# Patient Record
Sex: Female | Born: 1990 | Race: White | Hispanic: No | Marital: Married | Smoking: Former smoker
Health system: Southern US, Community
[De-identification: ages and names within clinical notes are randomized; demographics above are authoritative.]

## PROBLEM LIST (undated history)

## (undated) DIAGNOSIS — F411 Generalized anxiety disorder: Secondary | ICD-10-CM

## (undated) DIAGNOSIS — J4520 Mild intermittent asthma, uncomplicated: Secondary | ICD-10-CM

## (undated) HISTORY — PX: WISDOM TOOTH EXTRACTION: SHX21

## (undated) HISTORY — DX: Generalized anxiety disorder: F41.1

## (undated) HISTORY — DX: Mild intermittent asthma, uncomplicated: J45.20

---

## 2015-09-07 ENCOUNTER — Encounter: Payer: Self-pay | Admitting: Emergency Medicine

## 2015-09-07 ENCOUNTER — Emergency Department (INDEPENDENT_AMBULATORY_CARE_PROVIDER_SITE_OTHER)
Admission: EM | Admit: 2015-09-07 | Discharge: 2015-09-07 | Disposition: A | Discharge status: Home / Self Care | Payer: 59 | Source: Home / Self Care | Attending: Family Medicine | Admitting: Family Medicine

## 2015-09-07 ENCOUNTER — Emergency Department (INDEPENDENT_AMBULATORY_CARE_PROVIDER_SITE_OTHER): Payer: 59

## 2015-09-07 DIAGNOSIS — S8392XA Sprain of unspecified site of left knee, initial encounter: Secondary | ICD-10-CM

## 2015-09-07 DIAGNOSIS — M25562 Pain in left knee: Secondary | ICD-10-CM

## 2015-09-07 MED ORDER — TRAMADOL HCL 50 MG PO TABS: 50.0000 mg | ORAL_TABLET | Freq: Four times a day (QID) | ORAL | Status: DC | PRN

## 2015-09-07 MED ORDER — IBUPROFEN 600 MG PO TABS: 600.0000 mg | ORAL_TABLET | Freq: Four times a day (QID) | ORAL | Status: DC | PRN

## 2015-09-07 NOTE — ED Provider Notes (Signed)
CSN: [id number]     Arrival date & time 09/07/15  1304 History   First MD Initiated Contact with Patient 09/07/15 1330     Chief Complaint  Patient presents with  . Knee Injury   (Consider location/radiation/quality/duration/timing/severity/associated sxs/prior Treatment) HPI  Pt is a 24yo female presenting to [facility] with c/o Left knee pain and mild swelling that started yesterday while she was participating in an obstacle race. Pt initially felt pain at the beginning of the race when she helped her team members lift a heavy object.  After that, pt felt worsening pain in the front lateral aspect of her knee.  Pain is aching and sore, worse with palpation and ambulation.  Pain did improve after acetaminophen this morning. Denies any other injuries.   History reviewed. No pertinent past medical history. Past Surgical History  Procedure Laterality Date  . Cesarean section     Family History  Problem Relation Age of Onset  . Migraines Mother    Social History  Substance Use Topics  . Smoking status: Current Every Day Smoker    Types: Cigarettes  . Smokeless tobacco: Current User  . Alcohol Use: No   OB History    No data available     Review of Systems  Constitutional: Negative for fever and chills.  Musculoskeletal: Positive for myalgias, joint swelling, arthralgias and gait problem. Negative for back pain.  Skin: Negative for color change and wound.  Neurological: Positive for weakness. Negative for numbness.    Allergies  Review of patient's allergies indicates no known allergies.  Home Medications   Prior to Admission medications   Medication Sig Start Date End Date Taking? Authorizing Provider  acetaminophen (TYLENOL) 325 MG tablet Take 650 mg by mouth every 6 (six) hours as needed.   Yes Historical Provider, MD  clonazePAM (KLONOPIN) 0.5 MG tablet Take 0.5 mg by mouth 2 (two) times daily as needed for anxiety.   Yes Historical Provider, MD  levonorgestrel (MIRENA) 20  MCG/24HR IUD 1 each by Intrauterine route once.   Yes Historical Provider, MD  ibuprofen (ADVIL,MOTRIN) 600 MG tablet Take 1 tablet (600 mg total) by mouth every 6 (six) hours as needed. 09/07/15   Junius Finner, PA-C  traMADol (ULTRAM) 50 MG tablet Take 1 tablet (50 mg total) by mouth every 6 (six) hours as needed. 09/07/15   Junius Finner, PA-C   Meds Ordered and Administered this Visit  Medications - No data to display  BP 105/72 mmHg  Pulse 78  Temp(Src) 97.4 F (36.3 C) (Oral)  Ht  (1.626 m)  Wt 162 lb (73.483 kg)  BMI 27.79 kg/m2  SpO2 98% No data found.   Physical Exam  Constitutional: She is oriented to person, place, and time. She appears well-developed and well-nourished.  HENT:  Head: Normocephalic and atraumatic.  Eyes: EOM are normal.  Neck: Normal range of motion.  Cardiovascular: Normal rate.   Pulses:      Dorsalis pedis pulses are 2+ on the left side.  Pulmonary/Chest: Effort normal.  Musculoskeletal: Normal range of motion. She exhibits edema and tenderness.  Left knee: no deformity. Mild edema. Full ROM.  Tenderness to lateral joint space. Calf is soft, non-tender  Neurological: She is alert and oriented to person, place, and time.  Skin: Skin is warm and dry. No erythema.  Psychiatric: She has a normal mood and affect. Her behavior is normal.  Nursing note and vitals reviewed.   ED Course  Procedures (including critical care time)  Labs Review Labs Reviewed - No data to display  Imaging Review Dg Knee Complete 4 Views Left  09/07/2015  CLINICAL DATA:  Left knee pain after running over the weekend EXAM: LEFT KNEE - COMPLETE 4+ VIEW COMPARISON:  None. FINDINGS: Four views of the left knee submitted. No acute fracture or subluxation. No radiopaque foreign body. IMPRESSION: Negative. Electronically Signed   By: Natasha MeadLiviu  Pop M.D.   On: 09/07/2015 13:57      MDM   1. Left knee sprain, initial encounter   2. Left knee pain    Pt c/o Left knee  pain after injury during obstacle course yesterday. Plain films: normal Reassured pt no fracture, subluxation or joint effusion however, underlying soft tissue injury including ligament or meniscus could be injured. Pt placed in knee sleeve. Crutches provided for comfort. Encouraged pt to take it easy this week with her workouts.  If she feels pain, stop. F/u with Dr. Denyse Amassorey, Sports Medicine in 1-2 weeks if not improving. Sooner if worsening. Home exercises provided. Patient verbalized understanding and agreement with treatment plan.     Junius FinnerErin O'Malley, PA-C 09/07/15 [phone]

## 2015-09-07 NOTE — Discharge Instructions (Signed)
°  Tramadol is strong pain medication. While taking, do not drink alcohol, drive, or perform any other activities that requires focus while taking these medications.  ° °

## 2015-09-07 NOTE — ED Notes (Signed)
Left knee injury yesterday while running a race, knee locked up, felt excruciating pain

## 2015-11-16 ENCOUNTER — Ambulatory Visit: Payer: 59 | Admitting: Family Medicine

## 2015-11-30 ENCOUNTER — Ambulatory Visit (INDEPENDENT_AMBULATORY_CARE_PROVIDER_SITE_OTHER): Payer: 59 | Admitting: Family Medicine

## 2015-11-30 ENCOUNTER — Encounter: Payer: Self-pay | Admitting: Family Medicine

## 2015-11-30 VITALS — BP 132/77 | HR 88 | Ht 64.0 in | Wt 171.0 lb

## 2015-11-30 DIAGNOSIS — F411 Generalized anxiety disorder: Secondary | ICD-10-CM

## 2015-11-30 DIAGNOSIS — Z0000 Encounter for general adult medical examination without abnormal findings: Secondary | ICD-10-CM | POA: Diagnosis not present

## 2015-11-30 DIAGNOSIS — IMO0001 Reserved for inherently not codable concepts without codable children: Secondary | ICD-10-CM

## 2015-11-30 HISTORY — DX: Generalized anxiety disorder: F41.1

## 2015-11-30 MED ORDER — CLONAZEPAM 0.5 MG PO TABS: 0.5000 mg | ORAL_TABLET | Freq: Two times a day (BID) | ORAL | Status: DC | PRN

## 2015-11-30 NOTE — Patient Instructions (Signed)
Thank you for coming in today. Return in 1 year or sooner as needed.  Let me know if you need help quitting smoking.   Smoking Cessation, Tips for Success If you are ready to quit smoking, congratulations! You have chosen to help yourself be healthier. Cigarettes bring nicotine, tar, carbon monoxide, and other irritants into your body. Your lungs, heart, and blood vessels will be able to work better without these poisons. There are many different ways to quit smoking. Nicotine gum, nicotine patches, a nicotine inhaler, or nicotine nasal spray can help with physical craving. Hypnosis, support groups, and medicines help break the habit of smoking. WHAT THINGS CAN I DO TO MAKE QUITTING EASIER?  Here are some tips to help you quit for good:  Pick a date when you will quit smoking completely. Tell all of your friends and family about your plan to quit on that date.  Do not try to slowly cut down on the number of cigarettes you are smoking. Pick a quit date and quit smoking completely starting on that day.  Throw away all cigarettes.   Clean and remove all ashtrays from your home, work, and car.  On a card, write down your reasons for quitting. Carry the card with you and read it when you get the urge to smoke.  Cleanse your body of nicotine. Drink enough water and fluids to keep your urine clear or pale yellow. Do this after quitting to flush the nicotine from your body.  Learn to predict your moods. Do not let a bad situation be your excuse to have a cigarette. Some situations in your life might tempt you into wanting a cigarette.  Never have "just one" cigarette. It leads to wanting another and another. Remind yourself of your decision to quit.  Change habits associated with smoking. If you smoked while driving or when feeling stressed, try other activities to replace smoking. Stand up when drinking your coffee. Brush your teeth after eating. Sit in a different chair when you read the paper.  Avoid alcohol while trying to quit, and try to drink fewer caffeinated beverages. Alcohol and caffeine may urge you to smoke.  Avoid foods and drinks that can trigger a desire to smoke, such as sugary or spicy foods and alcohol.  Ask people who smoke not to smoke around you.  Have something planned to do right after eating or having a cup of coffee. For example, plan to take a walk or exercise.  Try a relaxation exercise to calm you down and decrease your stress. Remember, you may be tense and nervous for the first 2 weeks after you quit, but this will pass.  Find new activities to keep your hands busy. Play with a pen, coin, or rubber band. Doodle or draw things on paper.  Brush your teeth right after eating. This will help cut down on the craving for the taste of tobacco after meals. You can also try mouthwash.   Use oral substitutes in place of cigarettes. Try using lemon drops, carrots, cinnamon sticks, or chewing gum. Keep them handy so they are available when you have the urge to smoke.  When you have the urge to smoke, try deep breathing.  Designate your home as a nonsmoking area.  If you are a heavy smoker, ask your health care provider about a prescription for nicotine chewing gum. It can ease your withdrawal from nicotine.  Reward yourself. Set aside the cigarette money you save and buy yourself something nice.  Look for support from others. Join a support group or smoking cessation program. Ask someone at home or at work to help you with your plan to quit smoking.  Always ask yourself, "Do I need this cigarette or is this just a reflex?" Tell yourself, "Today, I choose not to smoke," or "I do not want to smoke." You are reminding yourself of your decision to quit.  Do not replace cigarette smoking with electronic cigarettes (commonly called e-cigarettes). The safety of e-cigarettes is unknown, and some may contain harmful chemicals.  If you relapse, do not give up! Plan  ahead and think about what you will do the next time you get the urge to smoke. HOW WILL I FEEL WHEN I QUIT SMOKING? You may have symptoms of withdrawal because your body is used to nicotine (the addictive substance in cigarettes). You may crave cigarettes, be irritable, feel very hungry, cough often, get headaches, or have difficulty concentrating. The withdrawal symptoms are only temporary. They are strongest when you first quit but will go away within 10-14 days. When withdrawal symptoms occur, stay in control. Think about your reasons for quitting. Remind yourself that these are signs that your body is healing and getting used to being without cigarettes. Remember that withdrawal symptoms are easier to treat than the major diseases that smoking can cause.  Even after the withdrawal is over, expect periodic urges to smoke. However, these cravings are generally short lived and will go away whether you smoke or not. Do not smoke! WHAT RESOURCES ARE AVAILABLE TO HELP ME QUIT SMOKING? Your health care provider can direct you to community resources or hospitals for support, which may include:  Group support.  Education.  Hypnosis.  Therapy.   This information is not intended to replace advice given to you by your health care provider. Make sure you discuss any questions you have with your health care provider.   Document Released: 07/22/2004 Document Revised: 11/14/2014 Document Reviewed: 04/11/2013 Elsevier Interactive Patient Education [employer]

## 2015-11-30 NOTE — Assessment & Plan Note (Signed)
Doing well. Currently attempting to quit. Discussed various quit options. She is happy with the patch at the current time. Return as needed. Health maintenance updated. Routine fasting blood work ordered.

## 2015-11-30 NOTE — Assessment & Plan Note (Signed)
Doing well. Prescribed Klonopin. If patient is taking the medicine the way she says she's taking it this 30 tablet should last more than one year. Return in one year.

## 2015-11-30 NOTE — Progress Notes (Addendum)
       Courtney Frey is a 25 y.o. female who presents to [name]: Primary Care today for wellness visit. Patient presents to clinic today to establish care and have a wellness visit as part of biometric screening for work. She feels well with no complaints. No chest pains palpitations or shortness of breath. She uses a Mirena IUD for contraception. Additionally she takes approximately about one Klonopin per month as part of her anxiety. Otherwise she does not take medications. The biggest event in her life is her smoking cessation attempt. She is tried to quit multiple times in the past and quit smoking again one day ago. She's using a 21 mg nicotine patch and tapering. She feels well and is optimistic. She notes that her husband also smokes and his smoking tends to sabotage her quit attempts. She notes that her last Pap smear was in 2015, last Tdap 2010, and flu vaccine was in October 2016. She notes that she received HIV screening in 2016 as well.   History reviewed. No pertinent past medical history. Past Surgical History  Procedure Laterality Date  . Cesarean section    . Wisdom tooth extraction     Social History  Substance Use Topics  . Smoking status: Current Every Day Smoker    Types: Cigarettes  . Smokeless tobacco: Never Used  . Alcohol Use: 0.6 oz/week    1 Glasses of wine per week   family history includes Alcohol abuse in her maternal uncle; Diabetes in her maternal grandmother and maternal uncle; Hypertension in her maternal grandmother; Migraines in her mother.  ROS as above Medications: Current Outpatient Prescriptions  Medication Sig Dispense Refill  . clonazePAM (KLONOPIN) 0.5 MG tablet Take 1 tablet (0.5 mg total) by mouth 2 (two) times daily as needed for anxiety. 30 tablet 0  . levonorgestrel (MIRENA) 20 MCG/24HR IUD 1 each by Intrauterine route once.     No current  facility-administered medications for this visit.   No Known Allergies   Exam:  BP 132/77 mmHg  Pulse 88  Ht  (1.626 m)  Wt 171 lb (77.565 kg)  BMI 29.34 kg/m2 Gen: Well NAD HEENT: EOMI,  MMM Lungs: Normal work of breathing. CTABL Heart: RRR no MRG Abd: NABS, Soft. Nondistended, Nontender Exts: Brisk capillary refill, warm and well perfused.   No results found for this or any previous visit (from the past 24 hour(s)). No results found.   Please see individual assessment and plan sections.

## 2015-12-01 LAB — COMPREHENSIVE METABOLIC PANEL
ALT: 9 U/L (ref 6–29)
AST: 15 U/L (ref 10–30)
Albumin: 4.1 g/dL (ref 3.6–5.1)
Alkaline Phosphatase: 67 U/L (ref 33–115)
BUN: 9 mg/dL (ref 7–25)
CHLORIDE: 108 mmol/L (ref 98–110)
CO2: 22 mmol/L (ref 20–31)
CREATININE: 0.87 mg/dL (ref 0.50–1.10)
Calcium: 9.1 mg/dL (ref 8.6–10.2)
Glucose, Bld: 104 mg/dL — ABNORMAL HIGH (ref 65–99)
POTASSIUM: 4.2 mmol/L (ref 3.5–5.3)
SODIUM: 137 mmol/L (ref 135–146)
TOTAL PROTEIN: 6.2 g/dL (ref 6.1–8.1)
Total Bilirubin: 0.6 mg/dL (ref 0.2–1.2)

## 2015-12-01 LAB — LIPID PANEL
CHOL/HDL RATIO: 3.7 ratio (ref <=5.0)
CHOLESTEROL: 137 mg/dL (ref 125–200)
HDL: 37 mg/dL — ABNORMAL LOW (ref >=46)
LDL CALC: 86 mg/dL (ref <130)
TRIGLYCERIDES: 72 mg/dL (ref <150)
VLDL: 14 mg/dL (ref <30)

## 2015-12-01 LAB — CBC
HCT: 42.4 % (ref 36.0–46.0)
Hemoglobin: 14.5 g/dL (ref 12.0–15.0)
MCH: 30.9 pg (ref 26.0–34.0)
MCHC: 34.2 g/dL (ref 30.0–36.0)
MCV: 90.4 fL (ref 78.0–100.0)
MPV: 10.6 fL (ref 8.6–12.4)
PLATELETS: 209 10*3/uL (ref 150–400)
RBC: 4.69 MIL/uL (ref 3.87–5.11)
RDW: 12.8 % (ref 11.5–15.5)
WBC: 5.9 10*3/uL (ref 4.0–10.5)

## 2015-12-01 LAB — TSH: TSH: 1.957 u[IU]/mL (ref 0.350–4.500)

## 2015-12-01 NOTE — Addendum Note (Signed)
Addended by: Rodolph Bong on: 12/01/2015 08:09 AM   Modules accepted: Orders, SmartSet

## 2015-12-02 LAB — VITAMIN D 25 HYDROXY (VIT D DEFICIENCY, FRACTURES): VIT D 25 HYDROXY: 22 ng/mL — AB (ref 30–100)

## 2015-12-02 LAB — HIV ANTIBODY (ROUTINE TESTING W REFLEX): HIV 1&2 Ab, 4th Generation: NONREACTIVE

## 2015-12-02 LAB — HEMOGLOBIN A1C
Hgb A1c MFr Bld: 5.6 % (ref <5.7)
Mean Plasma Glucose: 114 mg/dL (ref <117)

## 2015-12-02 NOTE — Addendum Note (Signed)
Addended by: Rodolph Bong on: 12/02/2015 12:48 PM   Modules accepted: Kipp Brood

## 2015-12-02 NOTE — Progress Notes (Signed)
Quick Note:  Vitamin D deficiency noted. Take 2000 units of vitamin D daily over-the-counter.   ______ 

## 2015-12-14 ENCOUNTER — Encounter: Payer: 59 | Admitting: Obstetrics & Gynecology

## 2016-03-28 ENCOUNTER — Telehealth: Payer: Self-pay | Admitting: Family Medicine

## 2016-03-28 DIAGNOSIS — F411 Generalized anxiety disorder: Secondary | ICD-10-CM

## 2016-03-28 NOTE — Telephone Encounter (Signed)
Patient called and adv that [facility]. Pt is req a referral. Thanks

## 2016-03-28 NOTE — Telephone Encounter (Signed)
Referral printed

## 2016-05-09 ENCOUNTER — Ambulatory Visit (HOSPITAL_COMMUNITY): Payer: 59 | Admitting: Licensed Clinical Social Worker

## 2016-07-05 ENCOUNTER — Encounter: Payer: 59 | Admitting: Obstetrics & Gynecology

## 2016-07-07 ENCOUNTER — Encounter: Payer: 59 | Admitting: Obstetrics & Gynecology

## 2016-10-11 IMAGING — CR DG KNEE COMPLETE 4+V*L*
4 series · 4 of 4 positions shown · non-contrast
Comparison: None.

CLINICAL DATA: Left knee pain after running over the weekend

EXAM:
LEFT KNEE - COMPLETE 4+ VIEW

[knee ap]
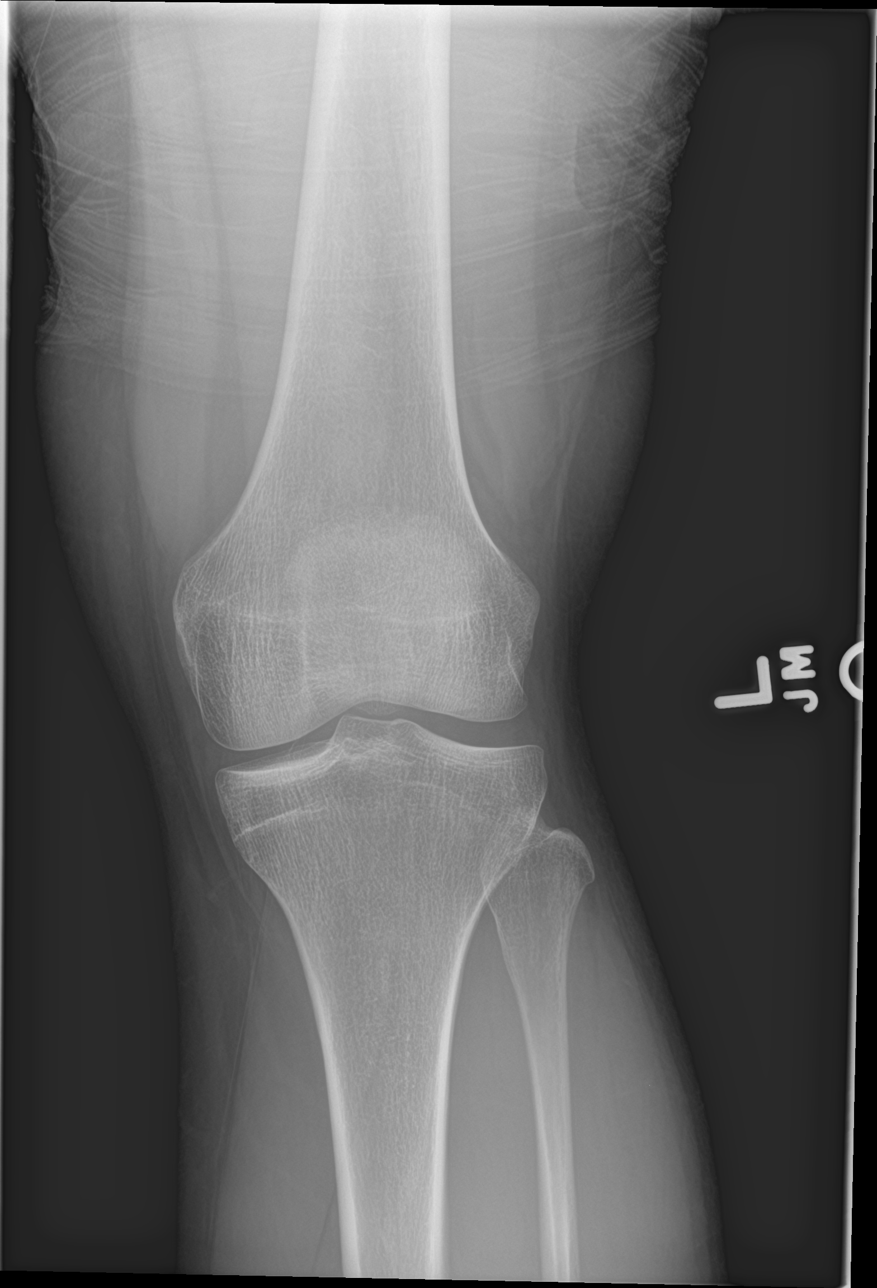

[knee lat]
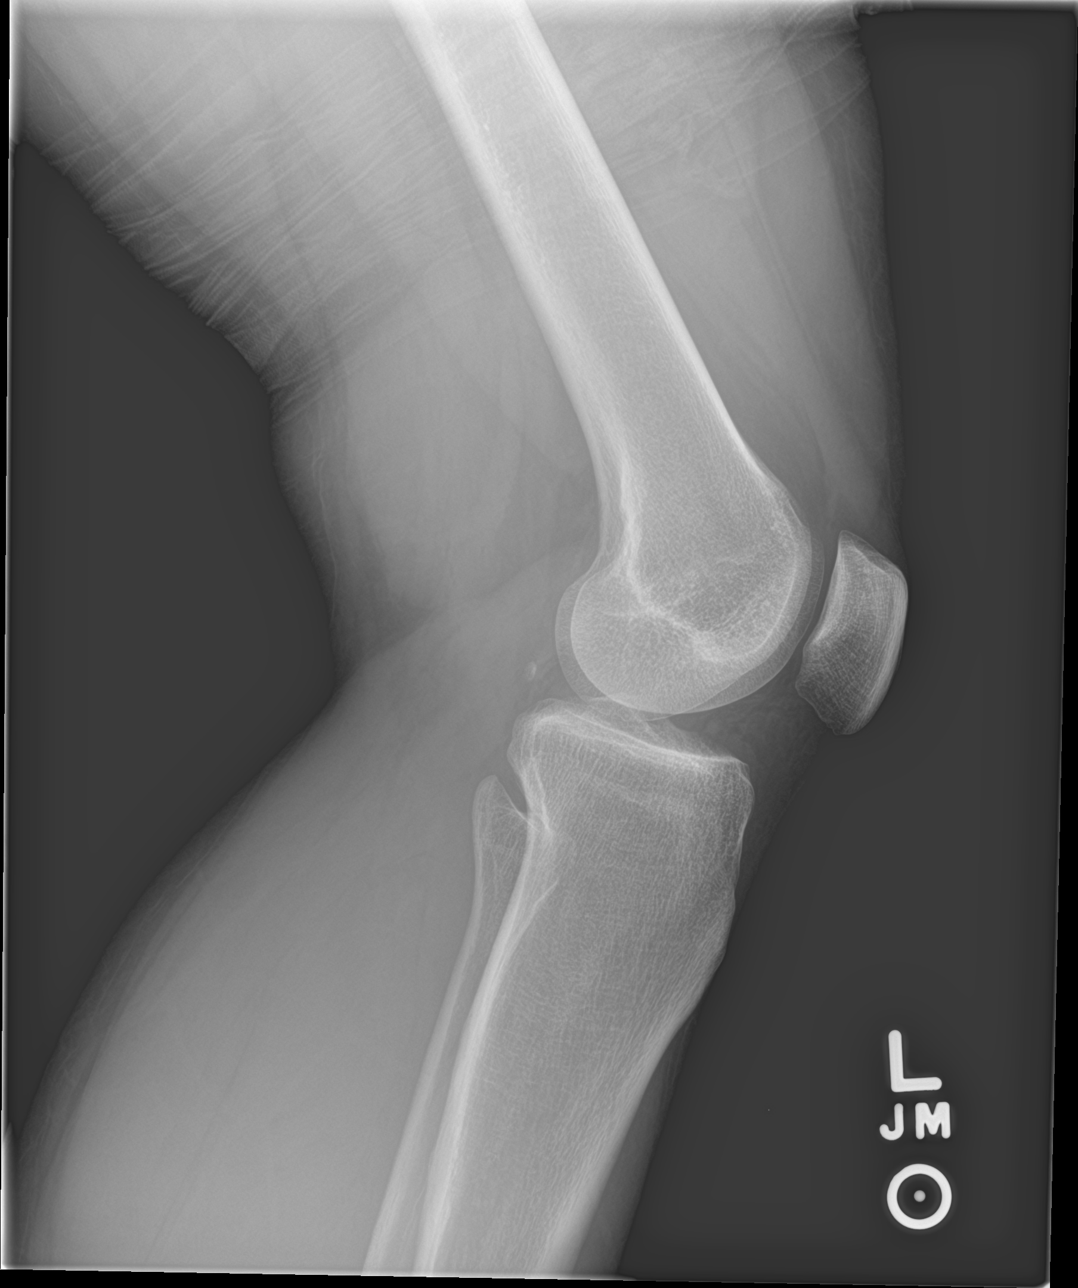

[knee obl (1 of 2)]
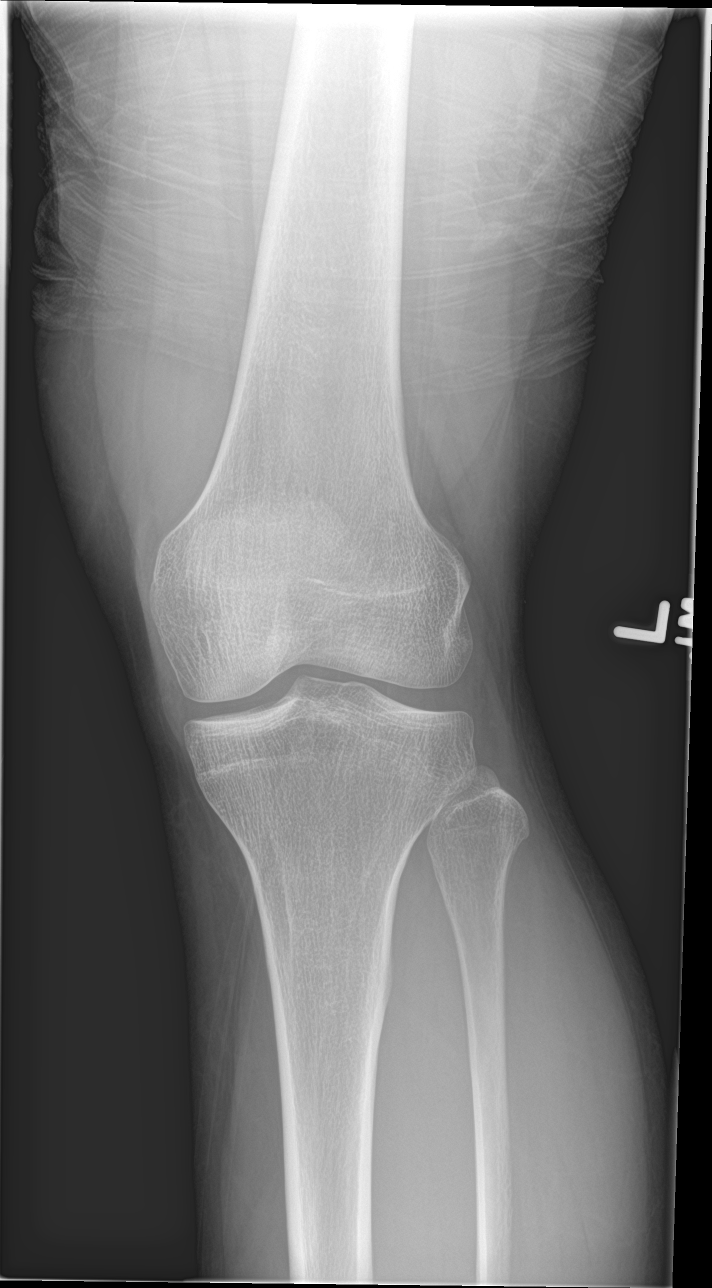

[knee obl (2 of 2)]
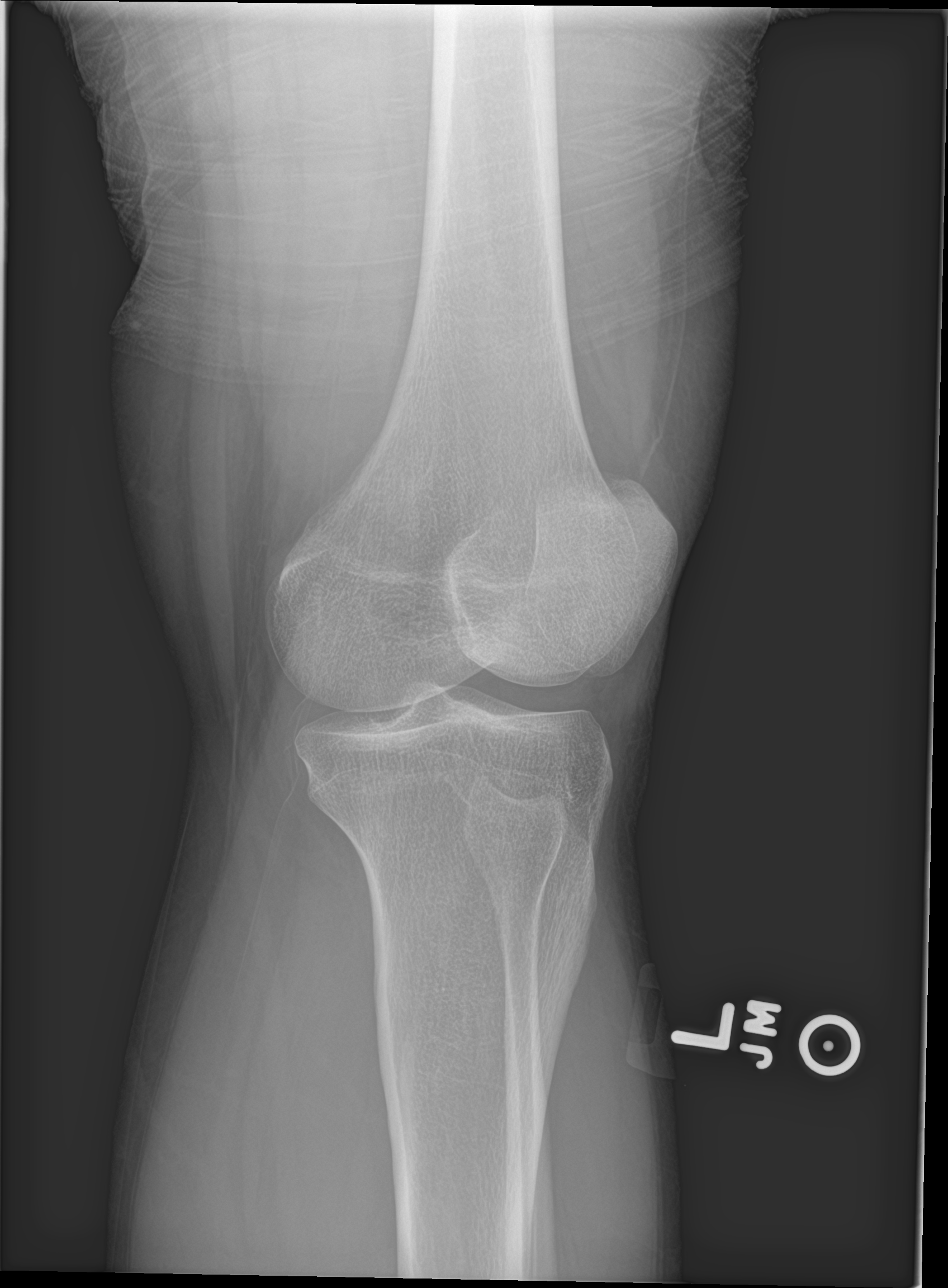

[4 of 4 positions shown; findings below may reference images not displayed]

FINDINGS: Four views of the left knee submitted. No acute fracture or
subluxation. No radiopaque foreign body.
IMPRESSION: Negative.

## 2016-12-01 ENCOUNTER — Encounter: Payer: Self-pay | Admitting: Family Medicine

## 2016-12-01 ENCOUNTER — Ambulatory Visit (INDEPENDENT_AMBULATORY_CARE_PROVIDER_SITE_OTHER): Payer: 59 | Admitting: Family Medicine

## 2016-12-01 VITALS — BP 98/69 | HR 98 | Temp 97.0°F | Wt 167.0 lb

## 2016-12-01 DIAGNOSIS — R69 Illness, unspecified: Secondary | ICD-10-CM | POA: Diagnosis not present

## 2016-12-01 DIAGNOSIS — F411 Generalized anxiety disorder: Secondary | ICD-10-CM

## 2016-12-01 DIAGNOSIS — J111 Influenza due to unidentified influenza virus with other respiratory manifestations: Secondary | ICD-10-CM

## 2016-12-01 MED ORDER — OSELTAMIVIR PHOSPHATE 75 MG PO CAPS: 75.0000 mg | ORAL_CAPSULE | Freq: Two times a day (BID) | ORAL | 0 refills | Status: DC

## 2016-12-01 MED ORDER — PREDNISONE 5 MG PO TABS: 30.0000 mg | ORAL_TABLET | Freq: Every day | ORAL | 0 refills | Status: DC

## 2016-12-01 MED ORDER — CLONAZEPAM 0.5 MG PO TABS: 0.5000 mg | ORAL_TABLET | Freq: Two times a day (BID) | ORAL | 0 refills | Status: DC | PRN

## 2016-12-01 NOTE — Progress Notes (Signed)
       Courtney Frey is a 26 y.o. female who [facility]: [facility] Sports Medicine today for body aches and fevers chills headache coughing congestion. Symptoms present for 24-36 hours now. She has multiple family members who have tested positive for influenza recently. She has tried some Sudafed which didn't help much. No vomiting or diarrhea chest pain palpitations or shortness of breath.  She notes her clonazepam has expired. She takes this medication very intermittently for anxiety. She thinks she takes it about once a month. She would like a refill.   No past medical history on file. Past Surgical History:  Procedure Laterality Date  . CESAREAN SECTION    . WISDOM TOOTH EXTRACTION     Social History  Substance Use Topics  . Smoking status: Current Every Day Smoker    Types: Cigarettes  . Smokeless tobacco: Never Used  . Alcohol use 0.6 oz/week    1 Glasses of wine per week   family history includes Alcohol abuse in her maternal uncle; Diabetes in her maternal grandmother and maternal uncle; Hypertension in her maternal grandmother; Migraines in her mother.  ROS as above:  Medications: Current Outpatient Prescriptions  Medication Sig Dispense Refill  . clonazePAM (KLONOPIN) 0.5 MG tablet Take 1 tablet (0.5 mg total) by mouth 2 (two) times daily as needed for anxiety. 30 tablet 0  . levonorgestrel (MIRENA) 20 MCG/24HR IUD 1 each by Intrauterine route once.    Marland Kitchen. oseltamivir (TAMIFLU) 75 MG capsule Take 1 capsule (75 mg total) by mouth 2 (two) times daily. 10 capsule 0  . predniSONE (DELTASONE) 5 MG tablet Take 6 tablets (30 mg total) by mouth daily with breakfast. If not better 30 tablet 0   No current facility-administered medications for this visit.    No Known Allergies  Health Maintenance Health Maintenance  Topic Date Due  . INFLUENZA VACCINE  06/07/2016  . PAP  SMEAR  11/07/2016  . TETANUS/TDAP  05/08/2019  . HIV Screening  Addressed     Exam:  BP 98/69   Pulse 98   Temp 97 F (36.1 C) (Oral)   Wt 167 lb (75.8 kg)   SpO2 98%   BMI 28.67 kg/m  Gen: Well NAD nontoxic appearing HEENT: EOMI,  MMM posterior pharynx and tympanic membranes bilaterally. Mild cervical lymphadenopathy present bilaterally. Clear nasal discharge is present. Lungs: Normal work of breathing. CTABL Heart: RRR no MRG Abd: NABS, Soft. Nondistended, Nontender Exts: Brisk capillary refill, warm and well perfused.  Psych: Alert and oriented normal speech thought process and affect   No results found for this or any previous visit (from the past 72 hour(s)). No results found.    Assessment and Plan: 26 y.o. female with  Influenza-like illness. Plan to treat empirically with Tamiflu as patient is a smoker. Prednisone as a Back up if not better.  Anxiety: Refill clonazepam  Patient should schedule a wellness exam for Pap smear in the near future.  No orders of the defined types were placed in this encounter.   Discussed warning signs or symptoms. Please see discharge instructions. Patient expresses understanding.

## 2016-12-01 NOTE — Patient Instructions (Signed)
Thank you for coming in today. Call or go to the emergency room if you get worse, have trouble breathing, have chest pains, or palpitations.   Use tamiflu.  Take prednisone if not better.   Schedule Pap Smear soon.   Call or go to the emergency room if you get worse, have trouble breathing, have chest pains, or palpitations.    Influenza, Adult Influenza, more commonly known as "the flu," is a viral infection that primarily affects the respiratory tract. The respiratory tract includes organs that help you breathe, such as the lungs, nose, and throat. The flu causes many common cold symptoms, as well as a high fever and body aches. The flu spreads easily from person to person (is contagious). Getting a flu shot (influenza vaccination) every year is the best way to prevent influenza. What are the causes? Influenza is caused by a virus. You can catch the virus by:  Breathing in droplets from an infected person's cough or sneeze.  Touching something that was recently contaminated with the virus and then touching your mouth, nose, or eyes. What increases the risk? The following factors may make you more likely to get the flu:  Not cleaning your hands frequently with soap and water or alcohol-based hand sanitizer.  Having close contact with many people during cold and flu season.  Touching your mouth, eyes, or nose without washing or sanitizing your hands first.  Not drinking enough fluids or not eating a healthy diet.  Not getting enough sleep or exercise.  Being under a high amount of stress.  Not getting a yearly (annual) flu shot. You may be at a higher risk of complications from the flu, such as a severe lung infection (pneumonia), if you:  Are over the age of 28.  Are pregnant.  Have a weakened disease-fighting system (immune system). You may have a weakened immune system if you:  Have HIV or AIDS.  Are undergoing chemotherapy.  Aretaking medicines that reduce the  activity of (suppress) the immune system.  Have a long-term (chronic) illness, such as heart disease, kidney disease, diabetes, or lung disease.  Have a liver disorder.  Are obese.  Have anemia. What are the signs or symptoms? Symptoms of this condition typically last 4-10 days and may include:  Fever.  Chills.  Headache, body aches, or muscle aches.  Sore throat.  Cough.  Runny or congested nose.  Chest discomfort and cough.  Poor appetite.  Weakness or tiredness (fatigue).  Dizziness.  Nausea or vomiting. How is this diagnosed? This condition may be diagnosed based on your medical history and a physical exam. Your health care provider may do a nose or throat swab test to confirm the diagnosis. How is this treated? If influenza is detected early, you can be treated with antiviral medicine that can reduce the length of your illness and the severity of your symptoms. This medicine may be given by mouth (orally) or through an IV tube that is inserted in one of your veins. The goal of treatment is to relieve symptoms by taking care of yourself at home. This may include taking over-the-counter medicines, drinking plenty of fluids, and adding humidity to the air in your home. In some cases, influenza goes away on its own. Severe influenza or complications from influenza may be treated in a hospital. Follow these instructions at home:  Take over-the-counter and prescription medicines only as told by your health care provider.  Use a cool mist humidifier to add humidity to the  air in your home. This can make breathing easier.  Rest as needed.  Drink enough fluid to keep your urine clear or pale yellow.  Cover your mouth and nose when you cough or sneeze.  Wash your hands with soap and water often, especially after you cough or sneeze. If soap and water are not available, use hand sanitizer.  Stay home from work or school as told by your health care provider. Unless you  are visiting your health care provider, try to avoid leaving home until your fever has been gone for 24 hours without the use of medicine.  Keep all follow-up visits as told by your health care provider. This is important. How is this prevented?  Getting an annual flu shot is the best way to avoid getting the flu. You may get the flu shot in late summer, fall, or winter. Ask your health care provider when you should get your flu shot.  Wash your hands often or use hand sanitizer often.  Avoid contact with people who are sick during cold and flu season.  Eat a healthy diet, drink plenty of fluids, get enough sleep, and exercise regularly. Contact a health care provider if:  You develop new symptoms.  You have:  Chest pain.  Diarrhea.  A fever.  Your cough gets worse.  You produce more mucus.  You feel nauseous or you vomit. Get help right away if:  You develop shortness of breath or difficulty breathing.  Your skin or nails turn a bluish color.  You have severe pain or stiffness in your neck.  You develop a sudden headache or sudden pain in your face or ear.  You cannot stop vomiting. This information is not intended to replace advice given to you by your health care provider. Make sure you discuss any questions you have with your health care provider. Document Released: 10/21/2000 Document Revised: 03/31/2016 Document Reviewed: 08/18/2015 Elsevier Interactive Patient Education  2017 [employer].

## 2017-07-12 ENCOUNTER — Other Ambulatory Visit: Payer: Self-pay | Admitting: Family Medicine

## 2017-07-14 ENCOUNTER — Telehealth: Payer: Self-pay

## 2017-07-14 MED ORDER — CLONAZEPAM 0.5 MG PO TABS: 0.5000 mg | ORAL_TABLET | Freq: Two times a day (BID) | ORAL | 0 refills | Status: DC | PRN

## 2017-07-14 NOTE — Telephone Encounter (Signed)
Pt would like to have her Mirena removed on during her 09/06/2017 OV. Please advise if you are ok with performing this procedure.

## 2017-07-14 NOTE — Telephone Encounter (Signed)
Pt left a detailed vm questioning why her rx for klonopin was denied. Returned call and left detailed VM advising pt that an OV is needed for further refills. Advised that a short tem refill will be sent. Provided contact information to call back and schedule appt.

## 2017-07-17 NOTE — Telephone Encounter (Signed)
OK to do-

## 2017-07-31 ENCOUNTER — Other Ambulatory Visit (HOSPITAL_COMMUNITY)
Admission: RE | Admit: 2017-07-31 | Discharge: 2017-07-31 | Disposition: A | Discharge status: Home / Self Care | Payer: Self-pay | Source: Ambulatory Visit | Attending: Family Medicine | Admitting: Family Medicine

## 2017-07-31 ENCOUNTER — Encounter: Payer: Self-pay | Admitting: Family Medicine

## 2017-07-31 ENCOUNTER — Ambulatory Visit (INDEPENDENT_AMBULATORY_CARE_PROVIDER_SITE_OTHER): Payer: Self-pay | Admitting: Family Medicine

## 2017-07-31 VITALS — BP 118/75 | HR 97 | Wt 153.0 lb

## 2017-07-31 DIAGNOSIS — Z30432 Encounter for removal of intrauterine contraceptive device: Secondary | ICD-10-CM

## 2017-07-31 DIAGNOSIS — Z0000 Encounter for general adult medical examination without abnormal findings: Secondary | ICD-10-CM

## 2017-07-31 DIAGNOSIS — Z124 Encounter for screening for malignant neoplasm of cervix: Secondary | ICD-10-CM

## 2017-07-31 DIAGNOSIS — F411 Generalized anxiety disorder: Secondary | ICD-10-CM

## 2017-07-31 MED ORDER — SERTRALINE HCL 50 MG PO TABS: ORAL_TABLET | ORAL | 0 refills | Status: DC

## 2017-07-31 MED ORDER — CLONAZEPAM 0.5 MG PO TABS: 0.5000 mg | ORAL_TABLET | Freq: Two times a day (BID) | ORAL | 0 refills | Status: DC | PRN

## 2017-07-31 NOTE — Progress Notes (Signed)
Courtney Frey is a 26 y.o. female who presents to [facility]: Primary Care Sports Medicine today for well adult visit removal of IUD and discuss anxiety.  Well adult. Courtney Frey is doing reasonably well. She notes anxiety discussed above and would like to remove IUD because she is planning on becoming pregnant in the near future. Additionally she would like a Pap smear. She does not exercise regularly and is working on quitting smoking.  She notes her anxiety has been worse recently. She is in between jobs and notes this is a source of anxiety for her. She is not sleeping as well as she normally does and notes that her anxieties worsen. Typically her anxiety is well controlled with intermittent Klonopin. She typically takes about 30 pills in one year. She notes her symptoms have worsened recently. She notes in the past she did not tolerate Lexapro.  She is currently receiving counseling which she does think helps.   Past Medical History:  Diagnosis Date  . Generalized anxiety disorder 11/30/2015   Past Surgical History:  Procedure Laterality Date  . CESAREAN SECTION    . WISDOM TOOTH EXTRACTION     Social History  Substance Use Topics  . Smoking status: Current Every Day Smoker    Types: Cigarettes  . Smokeless tobacco: Never Used  . Alcohol use 0.6 oz/week    1 Glasses of wine per week   family history includes Alcohol abuse in her maternal uncle; Diabetes in her maternal grandmother and maternal uncle; Hypertension in her maternal grandmother; Migraines in her mother.  ROS as above:  Medications: Current Outpatient Prescriptions  Medication Sig Dispense Refill  . clonazePAM (KLONOPIN) 0.5 MG tablet Take 1 tablet (0.5 mg total) by mouth 2 (two) times daily as needed for anxiety. 15 tablet 0  . sertraline (ZOLOFT) 50 MG tablet 1/2 pill po daily for 1 week then 1 pill daily for 1 week 30  tablet 0   No current facility-administered medications for this visit.    No Known Allergies  Health Maintenance Health Maintenance  Topic Date Due  . PAP SMEAR  11/07/2016  . INFLUENZA VACCINE  07/31/2018 (Originally 06/07/2017)  . TETANUS/TDAP  05/08/2019  . HIV Screening  Completed     Exam:  BP 118/75   Pulse 97   Wt 153 lb (69.4 kg)   BMI 26.26 kg/m  Gen: Well NAD HEENT: EOMI,  MMM Lungs: Normal work of breathing. CTABL Heart: RRR no MRG Abd: NABS, Soft. Nondistended, Nontender Exts: Brisk capillary refill, warm and well perfused.  Psych alert and oriented normal speech thought process and affect. Tearful at times. No SI or HI expressed. GYN: Normal external genitalia. No significant vaginal discharge. Cervix is normal-appearing IUD strings are present. Bimanual exam reveals no masses or tenderness.  GAD 7 : Generalized Anxiety Score 07/31/2017  Nervous, Anxious, on Edge 2  Control/stop worrying 3  Worry too much - different things 3  Trouble relaxing 3  Restless 2  Easily annoyed or irritable 2  Afraid - awful might happen 3  Total GAD 7 Score 18    Depression screen PHQ 2/9 07/31/2017  Decreased Interest 1  Down, Depressed, Hopeless 1  PHQ - 2 Score 2  Altered sleeping 1  Tired, decreased energy 2  Change in appetite 1  Feeling bad or failure about yourself  1  Trouble concentrating 1  Moving slowly or fidgety/restless 0  Suicidal thoughts 0  PHQ-9 Score  8    IUD Removal: Consent obtained and timeout performed. Speculum inserted and Pap smear obtained. Cervix clean with Hibiclens and the IUD strings were grasped with a ring forcep. The IUD was easily removable without significant pain. Patient tolerated the procedure well. Patient warned about using contraception if she does not want to become pregnant.   No results found for this or any previous visit (from the past 72 hour(s)). No results found.    Assessment and Plan: 26 y.o. female with    Well adult. Doing reasonably well. Discussed smoking cessation.  Anxiety: Patient has a diagnosis of generalized anxiety disorder today. Land to start Zoloft taper with a goal of achieving 100 mg per day in the near future. Continue low-dose intermittent Klonopin. Recheck in 3 weeks.  IUD: Removed. Warned about pregnancy risk in the near future.  Cervical cancer screening: Pap smear obtained today.   No orders of the defined types were placed in this encounter.  Meds ordered this encounter  Medications  . sertraline (ZOLOFT) 50 MG tablet    Sig: 1/2 pill po daily for 1 week then 1 pill daily for 1 week    Dispense:  30 tablet    Refill:  0  . clonazePAM (KLONOPIN) 0.5 MG tablet    Sig: Take 1 tablet (0.5 mg total) by mouth 2 (two) times daily as needed for anxiety.    Dispense:  15 tablet    Refill:  0     Discussed warning signs or symptoms. Please see discharge instructions. Patient expresses understanding.

## 2017-07-31 NOTE — Patient Instructions (Signed)
Thank you for coming in today. Start zoloft 1/2 pill daily for 1 week.  Increase to 1 pill daily for 1 week.  After 3 weeks return for visit.   You are able to get pregnant now.  Make sure to use birth control like condoms if you do not want to get pregnant.   Sertraline tablets What is this medicine? SERTRALINE (SER tra leen) is used to treat depression. It may also be used to treat obsessive compulsive disorder, panic disorder, post-trauma stress, premenstrual dysphoric disorder (PMDD) or social anxiety. This medicine may be used for other purposes; ask your health care provider or pharmacist if you have questions. COMMON BRAND NAME(S): Zoloft What should I tell my health care provider before I take this medicine? They need to know if you have any of these conditions: -bleeding disorders -bipolar disorder or a family history of bipolar disorder -glaucoma -heart disease -high blood pressure -history of irregular heartbeat -history of low levels of calcium, magnesium, or potassium in the blood -if you often drink alcohol -liver disease -receiving electroconvulsive therapy -seizures -suicidal thoughts, plans, or attempt; a previous suicide attempt by you or a family member -take medicines that treat or prevent blood clots -thyroid disease -an unusual or allergic reaction to sertraline, other medicines, foods, dyes, or preservatives -pregnant or trying to get pregnant -breast-feeding How should I use this medicine? Take this medicine by mouth with a glass of water. Follow the directions on the prescription label. You can take it with or without food. Take your medicine at regular intervals. Do not take your medicine more often than directed. Do not stop taking this medicine suddenly except upon the advice of your doctor. Stopping this medicine too quickly may cause serious side effects or your condition may worsen. A special MedGuide will be given to you by the pharmacist with each  prescription and refill. Be sure to read this information carefully each time. Talk to your pediatrician regarding the use of this medicine in children. While this drug may be prescribed for children as young as 7 years for selected conditions, precautions do apply. Overdosage: If you think you have taken too much of this medicine contact a poison control center or emergency room at once. NOTE: This medicine is only for you. Do not share this medicine with others. What if I miss a dose? If you miss a dose, take it as soon as you can. If it is almost time for your next dose, take only that dose. Do not take double or extra doses. What may interact with this medicine? Do not take this medicine with any of the following medications: -cisapride -dofetilide -dronedarone -linezolid -MAOIs like Carbex, Eldepryl, Marplan, Nardil, and Parnate -methylene blue (injected into a vein) -pimozide -thioridazine This medicine may also interact with the following medications: -alcohol -amphetamines -aspirin and aspirin-like medicines -certain medicines for depression, anxiety, or psychotic disturbances -certain medicines for fungal infections like ketoconazole, fluconazole, posaconazole, and itraconazole -certain medicines for irregular heart beat like flecainide, quinidine, propafenone -certain medicines for migraine headaches like almotriptan, eletriptan, frovatriptan, naratriptan, rizatriptan, sumatriptan, zolmitriptan -certain medicines for sleep -certain medicines for seizures like carbamazepine, valproic acid, phenytoin -certain medicines that treat or prevent blood clots like warfarin, enoxaparin, dalteparin -cimetidine -digoxin -diuretics -fentanyl -isoniazid -lithium -NSAIDs, medicines for pain and inflammation, like ibuprofen or naproxen -other medicines that prolong the QT interval (cause an abnormal heart rhythm) -rasagiline -safinamide -supplements like St. John's wort, kava kava,  valerian -tolbutamide -tramadol -tryptophan This list may  not describe all possible interactions. Give your health care provider a list of all the medicines, herbs, non-prescription drugs, or dietary supplements you use. Also tell them if you smoke, drink alcohol, or use illegal drugs. Some items may interact with your medicine. What should I watch for while using this medicine? Tell your doctor if your symptoms do not get better or if they get worse. Visit your doctor or health care professional for regular checks on your progress. Because it may take several weeks to see the full effects of this medicine, it is important to continue your treatment as prescribed by your doctor. Patients and their families should watch out for new or worsening thoughts of suicide or depression. Also watch out for sudden changes in feelings such as feeling anxious, agitated, panicky, irritable, hostile, aggressive, impulsive, severely restless, overly excited and hyperactive, or not being able to sleep. If this happens, especially at the beginning of treatment or after a change in dose, call your health care professional. Bonita Quin may get drowsy or dizzy. Do not drive, use machinery, or do anything that needs mental alertness until you know how this medicine affects you. Do not stand or sit up quickly, especially if you are an older patient. This reduces the risk of dizzy or fainting spells. Alcohol may interfere with the effect of this medicine. Avoid alcoholic drinks. Your mouth may get dry. Chewing sugarless gum or sucking hard candy, and drinking plenty of water may help. Contact your doctor if the problem does not go away or is severe. What side effects may I notice from receiving this medicine? Side effects that you should report to your doctor or health care professional as soon as possible: -allergic reactions like skin rash, itching or hives, swelling of the face, lips, or tongue -anxious -black, tarry  stools -changes in vision -confusion -elevated mood, decreased need for sleep, racing thoughts, impulsive behavior -eye pain -fast, irregular heartbeat -feeling faint or lightheaded, falls -feeling agitated, angry, or irritable -hallucination, loss of contact with reality -loss of balance or coordination -loss of memory -painful or prolonged erections -restlessness, pacing, inability to keep still -seizures -stiff muscles -suicidal thoughts or other mood changes -trouble sleeping -unusual bleeding or bruising -unusually weak or tired -vomiting Side effects that usually do not require medical attention (report to your doctor or health care professional if they continue or are bothersome): -change in appetite or weight -change in sex drive or performance -diarrhea -increased sweating -indigestion, nausea -tremors This list may not describe all possible side effects. Call your doctor for medical advice about side effects. You may report side effects to FDA at 1-800-FDA-1088. Where should I keep my medicine? Keep out of the reach of children. Store at room temperature between 15 and 30 degrees C (59 and 86 degrees F). Throw away any unused medicine after the expiration date. NOTE: This sheet is a summary. It may not cover all possible information. If you have questions about this medicine, talk to your doctor, pharmacist, or health care provider.  2018 Elsevier/Gold Standard (2016-10-28 14:17:49)

## 2017-08-04 ENCOUNTER — Telehealth: Payer: Self-pay

## 2017-08-04 LAB — CYTOLOGY - PAP: DIAGNOSIS: NEGATIVE

## 2017-08-04 NOTE — Telephone Encounter (Signed)
Pt called stating that experienced nausea, vomitting, headache, and fatigue after starting zoloft. Pt would like to hold off on taking anxiety medication for a month to allow her body to adjust to not having the mirena. Advised pt to move her follow up appointment back by 1 month and if she is still having symptoms at that time a new treatment plan can be made. Also advised pt to return to clinic sooner if needed.

## 2017-08-21 ENCOUNTER — Ambulatory Visit: Payer: Self-pay | Admitting: Family Medicine

## 2017-09-06 ENCOUNTER — Encounter: Payer: 59 | Admitting: Family Medicine

## 2017-09-21 ENCOUNTER — Encounter: Payer: Self-pay | Admitting: Family Medicine

## 2017-09-21 ENCOUNTER — Ambulatory Visit (INDEPENDENT_AMBULATORY_CARE_PROVIDER_SITE_OTHER): Payer: Self-pay | Admitting: Family Medicine

## 2017-09-21 VITALS — BP 104/68 | HR 64 | Temp 97.5°F | Ht 64.0 in | Wt 154.0 lb

## 2017-09-21 DIAGNOSIS — F411 Generalized anxiety disorder: Secondary | ICD-10-CM

## 2017-09-21 DIAGNOSIS — Z23 Encounter for immunization: Secondary | ICD-10-CM

## 2017-09-21 DIAGNOSIS — F17200 Nicotine dependence, unspecified, uncomplicated: Secondary | ICD-10-CM

## 2017-09-21 NOTE — Progress Notes (Signed)
Courtney Frey is a 26 y.o. female who [facility] [place]: Primary Care [facility] Medicine today for follow-up anxiety, smoking and prenatal counseling.  Anxiety:  Courtney Frey was seen about 2 months ago for anxiety.  She was started on Zoloft.  She notes that she did not tolerate the Zoloft very well and stopped it.  She does note however her anxiety has improved on its own.  She is essentially asymptomatic.  She uses Klonopin very intermittently approximately 4 tablets in the last 2 months.  Additionally Courtney Frey is attempting to become pregnant.  We remove the Mirena IUD 2 months ago.  She has been having unprotected sex and intends to become pregnant.  Her last menstrual period was about a month ago however she recently had a negative urine pregnancy test.  She has not yet started taking prenatal vitamins.  Smoking: Courtney Frey smokes about 1/2 pack/day.  Her husband also smokes.  She would like to quit and has done well with patches in the past.   Past Medical History:  Diagnosis Date  . Generalized anxiety disorder 11/30/2015   Past Surgical History:  Procedure Laterality Date  . CESAREAN SECTION    . WISDOM TOOTH EXTRACTION     Social History   Tobacco Use  . Smoking status: Current Every Day Smoker    Types: Cigarettes  . Smokeless tobacco: Never Used  Substance Use Topics  . Alcohol use: Yes    Alcohol/week: 0.6 oz    Types: 1 Glasses of wine per week   family history includes Alcohol abuse in her maternal uncle; Diabetes in her maternal grandmother and maternal uncle; Hypertension in her maternal grandmother; Migraines in her mother.  ROS as above:  Medications: Current Outpatient Medications  Medication Sig Dispense Refill  . clonazePAM (KLONOPIN) 0.5 MG tablet Take 1 tablet (0.5 mg total) by mouth 2 (two) times daily as needed for anxiety. 15 tablet 0   No current  facility-administered medications for this visit.    No Known Allergies  Health Maintenance Health Maintenance  Topic Date Due  . INFLUENZA VACCINE  07/31/2018 (Originally 06/07/2017)  . TETANUS/TDAP  05/08/2019  . PAP SMEAR  07/31/2020  . HIV Screening  Completed     Exam:  BP 104/68   Pulse 64   Temp (!) 97.5 F (36.4 C) (Oral)   Ht 5\' 4"  (1.626 m)   Wt 154 lb (69.9 kg)   BMI 26.43 kg/m  Gen: Well NAD HEENT: EOMI,  MMM Lungs: Normal work of breathing. CTABL Heart: RRR no MRG Abd: NABS, Soft. Nondistended, Nontender Exts: Brisk capillary refill, warm and well perfused.  Psych alert and oriented normal speech thought process and affect.  No SI or HI expressed.   No results found for this or any previous visit (from the past 72 hour(s)). No results found.    Assessment and Plan: 26 y.o. female with  Anxiety doing well continue current regimen.  Recommend sparing use of Klonopin.  Recheck as needed sooner if needed.  Prenatal counseling: Discussed safety of Klonopin with pregnancy and recommended sparing use of the this medicine. I also recommend starting prenatal vitamins now.  Patient will contact me if she becomes pregnant and will refer to OB/GYN.  Smoking: Discussed through smoking cessation techniques.  Recommend nicotine replacement patches that she is done well with those in the past.  I am happy to have a dedicated smoking cessation visit with her husband as well.  Flu  vaccine given.    Orders Placed This Encounter  Procedures  . Flu Vaccine QUAD 36+ mos IM (Fluarix & Fluzone Quad PF   No orders of the defined types were placed in this encounter.    Discussed warning signs or symptoms. Please see discharge instructions. Patient expresses understanding.

## 2017-09-21 NOTE — Patient Instructions (Signed)
Thank you for coming in today. Start prenatal vitamins.  Use klonopin sparingly.  Recheck as needed.    Prenatal Care WHAT IS PRENATAL CARE? Prenatal care is the process of caring for a pregnant woman before she gives birth. Prenatal care makes sure that she and her baby remain as healthy as possible throughout pregnancy. Prenatal care may be provided by a midwife, family practice health care provider, or a childbirth and pregnancy specialist (obstetrician). Prenatal care may include physical examinations, testing, treatments, and education on nutrition, lifestyle, and social support services. WHY IS PRENATAL CARE SO IMPORTANT? Early and consistent prenatal care increases the chance that you and your baby will remain healthy throughout your pregnancy. This type of care also decreases a baby's risk of being born too early (prematurely), or being born smaller than expected (small for gestational age). Any underlying medical conditions you may have that could pose a risk during your pregnancy are discussed during prenatal care visits. You will also be monitored regularly for any new conditions that may arise during your pregnancy so they can be treated quickly and effectively. WHAT HAPPENS DURING PRENATAL CARE VISITS? Prenatal care visits may include the following: Discussion Tell your health care provider about any new signs or symptoms you have experienced since your last visit. These might include:  Nausea or vomiting.  Increased or decreased level of energy.  Difficulty sleeping.  Back or leg pain.  Weight changes.  Frequent urination.  Shortness of breath with physical activity.  Changes in your skin, such as the development of a rash or itchiness.  Vaginal discharge or bleeding.  Feelings of excitement or nervousness.  Changes in your baby's movements.  You may want to write down any questions or topics you want to discuss with your health care provider and bring them with  you to your appointment. Examination During your first prenatal care visit, you will likely have a complete physical exam. Your health care provider will often examine your vagina, cervix, and the position of your uterus, as well as check your heart, lungs, and other body systems. As your pregnancy progresses, your health care provider will measure the size of your uterus and your baby's position inside your uterus. He or she may also examine you for early signs of labor. Your prenatal visits may also include checking your blood pressure and, after about 10-12 weeks of pregnancy, listening to your baby's heartbeat. Testing Regular testing often includes:  Urinalysis. This checks your urine for glucose, protein, or signs of infection.  Blood count. This checks the levels of white and red blood cells in your body.  Tests for sexually transmitted infections (STIs). Testing for STIs at the beginning of pregnancy is routinely done and is required in many states.  Antibody testing. You will be checked to see if you are immune to certain illnesses, such as rubella, that can affect a developing fetus.  Glucose screen. Around 24-28 weeks of pregnancy, your blood glucose level will be checked for signs of gestational diabetes. Follow-up tests may be recommended.  Group B strep. This is a bacteria that is commonly found inside a woman's vagina. This test will inform your health care provider if you need an antibiotic to reduce the amount of this bacteria in your body prior to labor and childbirth.  Ultrasound. Many pregnant women undergo an ultrasound screening around 18-20 weeks of pregnancy to evaluate the health of the fetus and check for any developmental abnormalities.  HIV (human immunodeficiency virus) testing. Early  in your pregnancy, you will be screened for HIV. If you are at high risk for HIV, this test may be repeated during your third trimester of pregnancy.  You may be offered other testing  based on your age, personal or family medical history, or other factors. HOW OFTEN SHOULD I PLAN TO SEE MY HEALTH CARE PROVIDER FOR PRENATAL CARE? Your prenatal care check-up schedule depends on any medical conditions you have before, or develop during, your pregnancy. If you do not have any underlying medical conditions, you will likely be seen for checkups:  Monthly, during the first 6 months of pregnancy.  Twice a month during months 7 and 8 of pregnancy.  Weekly starting in the 9th month of pregnancy and until delivery.  If you develop signs of early labor or other concerning signs or symptoms, you may need to see your health care provider more often. Ask your health care provider what prenatal care schedule is best for you. WHAT CAN I DO TO KEEP MYSELF AND MY BABY AS HEALTHY AS POSSIBLE DURING MY PREGNANCY?  Take a prenatal vitamin containing 400 micrograms (0.4 mg) of folic acid every day. Your health care provider may also ask you to take additional vitamins such as iodine, vitamin D, iron, copper, and zinc.  Take 1500-2000 mg of calcium daily starting at your 20th week of pregnancy until you deliver your baby.  Make sure you are up to date on your vaccinations. Unless directed otherwise by your health care provider: ? You should receive a tetanus, diphtheria, and pertussis (Tdap) vaccination between the 27th and 36th week of your pregnancy, regardless of when your last Tdap immunization occurred. This helps protect your baby from whooping cough (pertussis) after he or she is born. ? You should receive an annual inactivated influenza vaccine (IIV) to help protect you and your baby from influenza. This can be done at any point during your pregnancy.  Eat a well-rounded diet that includes: ? Fresh fruits and vegetables. ? Lean proteins. ? Calcium-rich foods such as milk, yogurt, hard cheeses, and dark, leafy greens. ? Whole grain breads.  Do noteat seafood high in mercury,  including: ? Swordfish. ? Tilefish. ? Shark. ? King mackerel. ? More than 6 oz tuna per week.  Do not eat: ? Raw or undercooked meats or eggs. ? Unpasteurized foods, such as soft cheeses (brie, blue, or feta), juices, and milks. ? Lunch meats. ? Hot dogs that have not been heated until they are steaming.  Drink enough water to keep your urine clear or pale yellow. For many women, this may be 10 or more 8 oz glasses of water each day. Keeping yourself hydrated helps deliver nutrients to your baby and may prevent the start of pre-term uterine contractions.  Do not use any tobacco products including cigarettes, chewing tobacco, or electronic cigarettes. If you need help quitting, ask your health care provider.  Do not drink beverages containing alcohol. No safe level of alcohol consumption during pregnancy has been determined.  Do not use any illegal drugs. These can harm your developing baby or cause a miscarriage.  Ask your health care provider or pharmacist before taking any prescription or over-the-counter medicines, herbs, or supplements.  Limit your caffeine intake to no more than 200 mg per day.  Exercise. Unless told otherwise by your health care provider, try to get 30 minutes of moderate exercise most days of the week. Do not  do high-impact activities, contact sports, or activities with a high risk  of falling, such as horseback riding or downhill skiing.  Get plenty of rest.  Avoid anything that raises your body temperature, such as hot tubs and saunas.  If you own a cat, do not empty its litter box. Bacteria contained in cat feces can cause an infection called toxoplasmosis. This can result in serious harm to the fetus.  Stay away from chemicals such as insecticides, lead, mercury, and cleaning or paint products that contain solvents.  Do not have any X-rays taken unless medically necessary.  Take a childbirth and breastfeeding preparation class. Ask your health care  provider if you need a referral or recommendation.  This information is not intended to replace advice given to you by your health care provider. Make sure you discuss any questions you have with your health care provider. Document Released: 10/27/2003 Document Revised: 03/28/2016 Document Reviewed: 01/08/2014 Elsevier Interactive Patient Education  2017 [employer]

## 2017-12-29 ENCOUNTER — Emergency Department
Admission: EM | Admit: 2017-12-29 | Discharge: 2017-12-29 | Disposition: A | Discharge status: Home / Self Care | Payer: Self-pay | Source: Home / Self Care | Attending: Emergency Medicine | Admitting: Emergency Medicine

## 2017-12-29 ENCOUNTER — Emergency Department (INDEPENDENT_AMBULATORY_CARE_PROVIDER_SITE_OTHER): Payer: Self-pay

## 2017-12-29 ENCOUNTER — Encounter: Payer: Self-pay | Admitting: *Deleted

## 2017-12-29 ENCOUNTER — Other Ambulatory Visit: Payer: Self-pay

## 2017-12-29 DIAGNOSIS — R0602 Shortness of breath: Secondary | ICD-10-CM

## 2017-12-29 DIAGNOSIS — F411 Generalized anxiety disorder: Secondary | ICD-10-CM

## 2017-12-29 LAB — POCT URINE PREGNANCY: Preg Test, Ur: NEGATIVE

## 2017-12-29 MED ORDER — FLUTICASONE PROPIONATE 50 MCG/ACT NA SUSP: 2.0000 | Freq: Every day | NASAL | 2 refills | Status: DC

## 2017-12-29 MED ORDER — ALBUTEROL SULFATE HFA 108 (90 BASE) MCG/ACT IN AERS: 1.0000 | INHALATION_SPRAY | Freq: Four times a day (QID) | RESPIRATORY_TRACT | 0 refills | Status: DC | PRN

## 2017-12-29 NOTE — ED Triage Notes (Signed)
Pt c/o SOB her entire life with dx of asthma as a teenager; she saw cardiology at age 27yo without any findings. She reports more SOB x 1 mth; URI x 2 days.

## 2017-12-29 NOTE — Discharge Instructions (Signed)
Please take your clonazepam if you feel short of breath. Please make an appointment to see Dr. Denyse Amassorey for further evaluation and consideration of a sleep study. Take Claritin 1 daily. Use Flonase 2 puffs each nares daily. You have an inhaler to use if you start to wheeze or get tight in her chest.

## 2017-12-29 NOTE — ED Provider Notes (Signed)
[facility]    CSN: [id number] Arrival date & time: 12/29/17  1425     History   Chief Complaint Chief Complaint  Patient presents with  . Shortness of Breath    HPI Courtney Frey is a 27 y.o. female.  Patient presents with shortness of breath. This has been a chronic problem for her going back to 5 years ago. She initially underwent evaluation through [facility]. At that time she saw a cardiologist and had a Holter monitor and stress test. She subsequently had a sleep study but was not able to complete the study and did not follow-up with their office. She states her symptoms seem to be environmental. She can be resting in her chair doing nothing and have an episode of shortness of breath. She states if she is in an environment with clean fresh air she does feel better. She also currently is having nasal congestion associated with clear rhinorrhea. HPI  Past Medical History:  Diagnosis Date  . Generalized anxiety disorder 11/30/2015    Patient Active Problem List   Diagnosis Date Noted  . Well adult 11/30/2015  . Generalized anxiety disorder 11/30/2015    Past Surgical History:  Procedure Laterality Date  . CESAREAN SECTION    . WISDOM TOOTH EXTRACTION      OB History    No data available       Home Medications    Prior to Admission medications   Medication Sig Start Date End Date Taking? Authorizing Provider  clonazePAM (KLONOPIN) 0.5 MG tablet Take 1 tablet (0.5 mg total) by mouth 2 (two) times daily as needed for anxiety. 07/31/17   Rodolph Bong, MD    Family History Family History  Problem Relation Age of Onset  . Migraines Mother   . Alcohol abuse Maternal Uncle   . Diabetes Maternal Uncle   . Diabetes Maternal Grandmother   . Hypertension Maternal Grandmother     Social History Social History   Tobacco Use  . Smoking status: Former Games developer  . Smokeless tobacco: Never Used  Substance Use Topics  . Alcohol use: Yes   Alcohol/week: 0.6 oz    Types: 1 Glasses of wine per week  . Drug use: No     Allergies   Penicillins   Review of Systems Review of Systems  Constitutional: Negative.   HENT: Positive for congestion and postnasal drip.   Eyes: Negative.   Respiratory: Positive for shortness of breath. Negative for cough and chest tightness.   Cardiovascular: Positive for palpitations. Negative for chest pain.  Gastrointestinal: Negative.   Psychiatric/Behavioral:       She has a history of anxiety disorder. She has 0.5 clonazepam at home but only takes it a couple of times a month.     Physical Exam Triage Vital Signs ED Triage Vitals  Enc Vitals Group     BP 12/29/17 1458 122/81     Pulse Rate 12/29/17 1458 89     Resp 12/29/17 1458 18     Temp 12/29/17 1458 98.4 F (36.9 C)     Temp Source 12/29/17 1458 Oral     SpO2 12/29/17 1458 100 %     Weight 12/29/17 1459 167 lb (75.8 kg)     Height 12/29/17 1459 5\' 4"  (1.626 m)     Head Circumference --      Peak Flow --      Pain Score 12/29/17 1458 0     Pain Loc --  Pain Edu? --      Excl. in GC? --    No data found.  Updated Vital Signs BP 122/81 (BP Location: Right Arm)   Pulse 89   Temp 98.4 F (36.9 C) (Oral)   Resp 18   Ht 5\' 4"  (1.626 m)   Wt 167 lb (75.8 kg)   LMP 12/18/2017   SpO2 100%   BMI 28.67 kg/m   Visual Acuity Right Eye Distance:   Left Eye Distance:   Bilateral Distance:    Right Eye Near:   Left Eye Near:    Bilateral Near:     Physical Exam  Constitutional: She appears well-developed and well-nourished.  HENT:  She has significant nasal congestion and clear rhinorrhea.  Neck: Normal range of motion. Neck supple.  Cardiovascular: Normal rate, regular rhythm and normal heart sounds. Exam reveals no friction rub.  No murmur heard. Pulmonary/Chest: Effort normal and breath sounds normal. She has no wheezes. She has no rhonchi. She has no rales.  Abdominal: Soft. Bowel sounds are normal.    Musculoskeletal:       Right lower leg: Normal.       Left lower leg: Normal.  Skin: Skin is warm and dry.     UC Treatments / Results  Labs (all labs ordered are listed, but only abnormal results are displayed) Labs Reviewed  POCT URINE PREGNANCY    EKG  EKG Interpretation None       Radiology Dg Chest 2 View  Result Date: 12/29/2017 CLINICAL DATA:  Shortness of breath EXAM: CHEST  2 VIEW COMPARISON:  None. FINDINGS: The heart size and mediastinal contours are within normal limits. Both lungs are clear. The visualized skeletal structures are unremarkable. IMPRESSION: Normal chest. Electronically Signed   By: Deatra RobinsonKevin  Herman M.D.   On: 12/29/2017 15:41    Procedures Procedures (including critical care time)  Medications Ordered in UC Medications - No data to display   Initial Impression / Assessment and Plan / UC Course  I have reviewed the triage vital signs and the nursing notes.  Pertinent labs & imaging results that were available during my care of the patient were reviewed by me and considered in my medical decision making (see chart for details). Please take your clonazepam if you feel short of breath. She also will have an inhaler at home to use as needed if you start to wheeze or get tight in her chest. He's need to have the appointment though to see Dr. Denyse Amassorey to be evaluated. . Take Claritin 1 daily. Use Flonase 2 puffs each nares daily.Marland Kitchen. She was insistent to have an inhaler at home for safety. I told her she needed to undergo pulmonary function test to see if she did or did not have asthma. I also told her I would suggest an overnight oximetry to see if she desaturates at night.      Final Clinical Impressions(s) / UC Diagnoses   Final diagnoses:  SOB (shortness of breath)  Generalized anxiety disorder    ED Discharge Orders    None       Controlled Substance Prescriptions [place] Controlled Substance Registry consulted? Not Applicable   Collene Gobbleaub, Steven  A, MD 12/29/17 [phone]

## 2018-01-17 ENCOUNTER — Encounter: Payer: Self-pay | Admitting: Family Medicine

## 2018-01-18 ENCOUNTER — Ambulatory Visit (INDEPENDENT_AMBULATORY_CARE_PROVIDER_SITE_OTHER): Payer: Self-pay | Admitting: Family Medicine

## 2018-01-18 ENCOUNTER — Encounter: Payer: Self-pay | Admitting: Family Medicine

## 2018-01-18 VITALS — BP 118/80 | HR 78 | Ht 64.0 in | Wt 170.0 lb

## 2018-01-18 DIAGNOSIS — J4520 Mild intermittent asthma, uncomplicated: Secondary | ICD-10-CM

## 2018-01-18 DIAGNOSIS — Z3A01 Less than 8 weeks gestation of pregnancy: Secondary | ICD-10-CM

## 2018-01-18 DIAGNOSIS — Z3490 Encounter for supervision of normal pregnancy, unspecified, unspecified trimester: Secondary | ICD-10-CM | POA: Insufficient documentation

## 2018-01-18 HISTORY — DX: Mild intermittent asthma, uncomplicated: J45.20

## 2018-01-18 LAB — POCT URINE PREGNANCY: PREG TEST UR: POSITIVE — AB

## 2018-01-18 NOTE — Patient Instructions (Signed)
Thank you for coming in today. You should hear from OBGYN.  Apply for medicaid and [place] [employer].  Start zyrtec daily for allergies.  If your albuterol use increase to more than 2 per week let me know.  Let me know if you need a refill. Research costs of prescription drugs using Good Rx   Asthma, Adult Asthma is a recurring condition in which the airways tighten and narrow. Asthma can make it difficult to breathe. It can cause coughing, wheezing, and shortness of breath. Asthma episodes, also called asthma attacks, range from minor to life-threatening. Asthma cannot be cured, but medicines and lifestyle changes can help control it. What are the causes? Asthma is believed to be caused by inherited (genetic) and environmental factors, but its exact cause is unknown. Asthma may be triggered by allergens, lung infections, or irritants in the air. Asthma triggers are different for each person. Common triggers include:  Animal dander.  Dust mites.  Cockroaches.  Pollen from trees or grass.  Mold.  Smoke.  Air pollutants such as dust, household cleaners, hair sprays, aerosol sprays, paint fumes, strong chemicals, or strong odors.  Cold air, weather changes, and winds (which increase molds and pollens in the air).  Strong emotional expressions such as crying or laughing hard.  Stress.  Certain medicines (such as aspirin) or types of drugs (such as beta-blockers).  Sulfites in foods and drinks. Foods and drinks that may contain sulfites include dried fruit, potato chips, and sparkling grape juice.  Infections or inflammatory conditions such as the flu, a cold, or an inflammation of the nasal membranes (rhinitis).  Gastroesophageal reflux disease (GERD).  Exercise or strenuous activity.  What are the signs or symptoms? Symptoms may occur immediately after asthma is triggered or many hours later. Symptoms include:  Wheezing.  Excessive nighttime or early morning  coughing.  Frequent or severe coughing with a common cold.  Chest tightness.  Shortness of breath.  How is this diagnosed? The diagnosis of asthma is made by a review of your medical history and a physical exam. Tests may also be performed. These may include:  Lung function studies. These tests show how much air you breathe in and out.  Allergy tests.  Imaging tests such as X-rays.  How is this treated? Asthma cannot be cured, but it can usually be controlled. Treatment involves identifying and avoiding your asthma triggers. It also involves medicines. There are 2 classes of medicine used for asthma treatment:  Controller medicines. These prevent asthma symptoms from occurring. They are usually taken every day.  Reliever or rescue medicines. These quickly relieve asthma symptoms. They are used as needed and provide short-term relief.  Your health care provider will help you create an asthma action plan. An asthma action plan is a written plan for managing and treating your asthma attacks. It includes a list of your asthma triggers and how they may be avoided. It also includes information on when medicines should be taken and when their dosage should be changed. An action plan may also involve the use of a device called a peak flow meter. A peak flow meter measures how well the lungs are working. It helps you monitor your condition. Follow these instructions at home:  Take medicines only as directed by your health care provider. Speak with your health care provider if you have questions about how or when to take the medicines.  Use a peak flow meter as directed by your health care provider. Record and keep  track of readings.  Understand and use the action plan to help minimize or stop an asthma attack without needing to seek medical care.  Control your home environment in the following ways to help prevent asthma attacks: ? Do not smoke. Avoid being exposed to secondhand  smoke. ? Change your heating and air conditioning filter regularly. ? Limit your use of fireplaces and wood stoves. ? Get rid of pests (such as roaches and mice) and their droppings. ? Throw away plants if you see mold on them. ? Clean your floors and dust regularly. Use unscented cleaning products. ? Try to have someone else vacuum for you regularly. Stay out of rooms while they are being vacuumed and for a short while afterward. If you vacuum, use a dust mask from a hardware store, a double-layered or microfilter vacuum cleaner bag, or a vacuum cleaner with a HEPA filter. ? Replace carpet with wood, tile, or vinyl flooring. Carpet can trap dander and dust. ? Use allergy-proof pillows, mattress covers, and box spring covers. ? Wash bed sheets and blankets every week in hot water and dry them in a dryer. ? Use blankets that are made of polyester or cotton. ? Clean bathrooms and kitchens with bleach. If possible, have someone repaint the walls in these rooms with mold-resistant paint. Keep out of the rooms that are being cleaned and painted. ? Wash hands frequently. Contact a health care provider if:  You have wheezing, shortness of breath, or a cough even if taking medicine to prevent attacks.  The colored mucus you cough up (sputum) is thicker than usual.  Your sputum changes from clear or white to yellow, green, gray, or bloody.  You have any problems that may be related to the medicines you are taking (such as a rash, itching, swelling, or trouble breathing).  You are using a reliever medicine more than 2-3 times per week.  Your peak flow is still at 50-79% of your personal best after following your action plan for 1 hour.  You have a fever. Get help right away if:  You seem to be getting worse and are unresponsive to treatment during an asthma attack.  You are short of breath even at rest.  You get short of breath when doing very little physical activity.  You have difficulty  eating, drinking, or talking due to asthma symptoms.  You develop chest pain.  You develop a fast heartbeat.  You have a bluish color to your lips or fingernails.  You are light-headed, dizzy, or faint.  Your peak flow is less than 50% of your personal best. This information is not intended to replace advice given to you by your health care provider. Make sure you discuss any questions you have with your health care provider. Document Released: 10/24/2005 Document Revised: 04/06/2016 Document Reviewed: 05/23/2013 Elsevier Interactive Patient Education  2017 [employer].

## 2018-01-18 NOTE — Progress Notes (Signed)
Courtney Frey is a 27 y.o. female who presents to [name] today for pregnancy.  Ms. Kitchens has intentionally become pregnant.  Her last menstrual period was February 7.  She has had several home pregnancy tests that have been positive.  She currently is taking prenatal vitamins.  Fortunately she does not have health insurance and think she probably makes too much money for Medicaid.  Her husband is a Surveyor, minerals and does not have health insurance available through his job.  She does not currently have a OB/GYN.   Additionally she was seen a few weeks ago for cough and wheezing and was prescribed albuterol inhaler.  She notes this helps a lot and she uses it once or twice per week.  She notes she had a history of asthma as a child.  She does not currently take any allergy medications but denies significant shortness of breath or wheezing itchy watery eyes or runny nose.   Past Medical History:  Diagnosis Date  . Generalized anxiety disorder 11/30/2015   Past Surgical History:  Procedure Laterality Date  . CESAREAN SECTION    . WISDOM TOOTH EXTRACTION     Social History   Tobacco Use  . Smoking status: Former Games developer  . Smokeless tobacco: Never Used  Substance Use Topics  . Alcohol use: Yes    Alcohol/week: 0.6 oz    Types: 1 Glasses of wine per week   family history includes Alcohol abuse in her maternal uncle; Diabetes in her maternal grandmother and maternal uncle; Hypertension in her maternal grandmother; Migraines in her mother.  ROS as above:  Medications: Current Outpatient Medications  Medication Sig Dispense Refill  . albuterol (PROVENTIL HFA;VENTOLIN HFA) 108 (90 Base) MCG/ACT inhaler Inhale 1-2 puffs into the lungs every 6 (six) hours as needed for wheezing or shortness of breath. 1 Inhaler 0  . fluticasone (FLONASE) 50 MCG/ACT nasal spray Place 2  sprays into both nostrils daily. 16 g 2   No current facility-administered medications for this visit.    Allergies  Allergen Reactions  . Penicillins     Health Maintenance Health Maintenance  Topic Date Due  . TETANUS/TDAP  05/08/2019  . PAP SMEAR  07/31/2020  . INFLUENZA VACCINE  Completed  . HIV Screening  Completed     Exam:  BP 118/80   Pulse 78   Ht 5\' 4"  (1.626 m)   Wt 170 lb (77.1 kg)   BMI 29.18 kg/m  Gen: Well NAD HEENT: EOMI,  MMM Lungs: Normal work of breathing. CTABL Heart: RRR no MRG Abd: NABS, Soft. Nondistended, Nontender Exts: Brisk capillary refill, warm and well perfused.    Results for orders placed or performed in visit on 01/18/18 (from the past 72 hour(s))  POCT urine pregnancy     Status: Abnormal   Collection Time: 01/18/18  9:37 AM  Result Value Ref Range   Preg Test, Ur Positive (A) Negative   No results found.    Assessment and Plan: 27 y.o. female with  Pregnancy confirmed.  Letter for pregnancy written in case she can apply for Medicaid.  I recommend trying to apply for pregnancy Medicaid.  Additionally recommend applying for [place] charity program.  I will contact my OB/GYN colleagues to see if there is any resources that would allow her to get discounted prenatal care without health insurance.  In the meantime I have referred her to OB/GYN here and [name] to start  prenatal care probably closer to 16 weeks.  Recommend continuing prenatal vitamins.  Asthma: Mild intermittent.  Continue albuterol.  If using more than 2 or 3 times per week especially at night next step would be controller medication likely Symbicort samples in the office.   Recheck PRN.  Orders Placed This Encounter  Procedures  . POCT urine pregnancy   No orders of the defined types were placed in this encounter.    Discussed warning signs or symptoms. Please see discharge instructions. Patient expresses understanding.

## 2018-01-24 ENCOUNTER — Encounter: Payer: Self-pay | Admitting: Family Medicine

## 2018-09-17 MED ORDER — PRENATAL 19 PO TABS
1.00 | ORAL_TABLET | ORAL | Status: DC
Start: 2018-09-17 — End: 2018-09-17

## 2018-09-17 MED ORDER — GENERIC EXTERNAL MEDICATION
12.50 | Status: DC
Start: ? — End: 2018-09-17

## 2018-09-17 MED ORDER — BENZOCAINE-MENTHOL 15-3.6 MG MT LOZG
1.00 | LOZENGE | OROMUCOSAL | Status: DC
Start: ? — End: 2018-09-17

## 2018-09-17 MED ORDER — LACTATED RINGERS IV SOLN
125.00 | INTRAVENOUS | Status: DC
Start: ? — End: 2018-09-17

## 2018-09-17 MED ORDER — HYDROCODONE-ACETAMINOPHEN 5-325 MG PO TABS
1.00 | ORAL_TABLET | ORAL | Status: DC
Start: ? — End: 2018-09-17

## 2018-09-17 MED ORDER — BUSPIRONE HCL 5 MG PO TABS
15.00 | ORAL_TABLET | ORAL | Status: DC
Start: 2018-09-16 — End: 2018-09-17

## 2018-09-17 MED ORDER — LANOLIN EX OINT
TOPICAL_OINTMENT | CUTANEOUS | Status: DC
Start: ? — End: 2018-09-17

## 2018-09-17 MED ORDER — BISACODYL 10 MG RE SUPP
10.00 | RECTAL | Status: DC
Start: ? — End: 2018-09-17

## 2018-09-17 MED ORDER — GENERIC EXTERNAL MEDICATION
Status: DC
Start: ? — End: 2018-09-17

## 2018-09-17 MED ORDER — DOCUSATE SODIUM 100 MG PO CAPS
100.00 | ORAL_CAPSULE | ORAL | Status: DC
Start: 2018-09-16 — End: 2018-09-17

## 2018-09-17 MED ORDER — SIMETHICONE 80 MG PO CHEW
80.00 | CHEWABLE_TABLET | ORAL | Status: DC
Start: ? — End: 2018-09-17

## 2018-09-17 MED ORDER — GUAIFENESIN 100 MG/5ML PO SYRP
200.00 | ORAL_SOLUTION | ORAL | Status: DC
Start: ? — End: 2018-09-17

## 2018-09-17 MED ORDER — GENERIC EXTERNAL MEDICATION
25.00 | Status: DC
Start: ? — End: 2018-09-17

## 2018-09-17 MED ORDER — LAMOTRIGINE 100 MG PO TABS
200.00 | ORAL_TABLET | ORAL | Status: DC
Start: 2018-09-17 — End: 2018-09-17

## 2018-09-17 MED ORDER — GENERIC EXTERNAL MEDICATION
4.00 | Status: DC
Start: ? — End: 2018-09-17

## 2018-09-17 MED ORDER — FERROUS SULFATE 325 (65 FE) MG PO TABS
325.00 | ORAL_TABLET | ORAL | Status: DC
Start: 2018-09-17 — End: 2018-09-17

## 2018-09-17 MED ORDER — BENZOCAINE-MENTHOL 20-0.5 % EX AERO
INHALATION_SPRAY | CUTANEOUS | Status: DC
Start: ? — End: 2018-09-17

## 2018-09-17 MED ORDER — MAGNESIUM HYDROXIDE 400 MG/5ML PO SUSP
30.00 | ORAL | Status: DC
Start: ? — End: 2018-09-17

## 2018-09-17 MED ORDER — OXYCODONE HCL 10 MG PO TABS
10.00 | ORAL_TABLET | ORAL | Status: DC
Start: ? — End: 2018-09-17

## 2018-09-17 MED ORDER — HYDROCODONE-ACETAMINOPHEN 10-325 MG PO TABS
1.00 | ORAL_TABLET | ORAL | Status: DC
Start: ? — End: 2018-09-17

## 2018-09-17 MED ORDER — MORPHINE SULFATE (PF) 10 MG/ML IV SOLN
10.00 | INTRAVENOUS | Status: DC
Start: ? — End: 2018-09-17

## 2018-09-17 MED ORDER — IBUPROFEN 800 MG PO TABS
800.00 | ORAL_TABLET | ORAL | Status: DC
Start: 2018-09-16 — End: 2018-09-17

## 2018-09-17 MED ORDER — VALACYCLOVIR HCL 500 MG PO TABS
1000.00 | ORAL_TABLET | ORAL | Status: DC
Start: 2018-09-17 — End: 2018-09-17

## 2018-09-17 MED ORDER — SALINE NASAL SPRAY 0.65 % NA SOLN
2.00 | NASAL | Status: DC
Start: ? — End: 2018-09-17

## 2018-09-17 MED ORDER — MORPHINE SULFATE (PF) 4 MG/ML IV SOLN
4.00 | INTRAVENOUS | Status: DC
Start: ? — End: 2018-09-17

## 2018-09-17 MED ORDER — HYDROMORPHONE HCL 1 MG/ML IJ SOLN
1.00 | INTRAMUSCULAR | Status: DC
Start: ? — End: 2018-09-17

## 2018-09-17 MED ORDER — ACETAMINOPHEN 325 MG PO TABS
650.00 | ORAL_TABLET | ORAL | Status: DC
Start: ? — End: 2018-09-17

## 2018-09-17 MED ORDER — ROPIVACAINE HCL-NACL 0.2-0.9 % IJ SOLN
550.00 | INTRAMUSCULAR | Status: DC
Start: ? — End: 2018-09-17

## 2018-09-18 ENCOUNTER — Telehealth: Payer: Self-pay | Admitting: Family Medicine

## 2018-09-18 NOTE — Telephone Encounter (Signed)
I see that you had your baby. Congrats!!!  I thinkh

## 2018-09-18 NOTE — Telephone Encounter (Signed)
I see that you had your baby.  This is fantastic news.  I remember that we discussed where your baby was going to get the pediatric care and I seem to remember that the baby is going to come here.  If this is the case please schedule a weight check and initial wellness exam with me in the near future typically a few days after leaving the hospital.

## 2018-09-18 NOTE — Telephone Encounter (Signed)
Left patient message to congratulate her and inquire about baby's pediatric care. Callback information left for mother to schedule for baby.

## 2018-09-18 NOTE — Telephone Encounter (Signed)
Routing to scheduler

## 2018-09-18 NOTE — Telephone Encounter (Signed)
Dr Denyse Amass has received paperwork for baby. Baby's chart has been created: Courtney Frey DOB [birth year]

## 2018-10-22 ENCOUNTER — Encounter: Payer: Self-pay | Admitting: Family Medicine

## 2018-10-25 ENCOUNTER — Encounter: Payer: Self-pay | Admitting: Family Medicine

## 2019-01-23 ENCOUNTER — Encounter: Payer: Self-pay | Admitting: Family Medicine

## 2019-02-02 IMAGING — DX DG CHEST 2V
2 series · 2 of 2 positions shown · non-contrast
Comparison: None.

CLINICAL DATA: Shortness of breath

EXAM:
CHEST  2 VIEW

[chest pa]
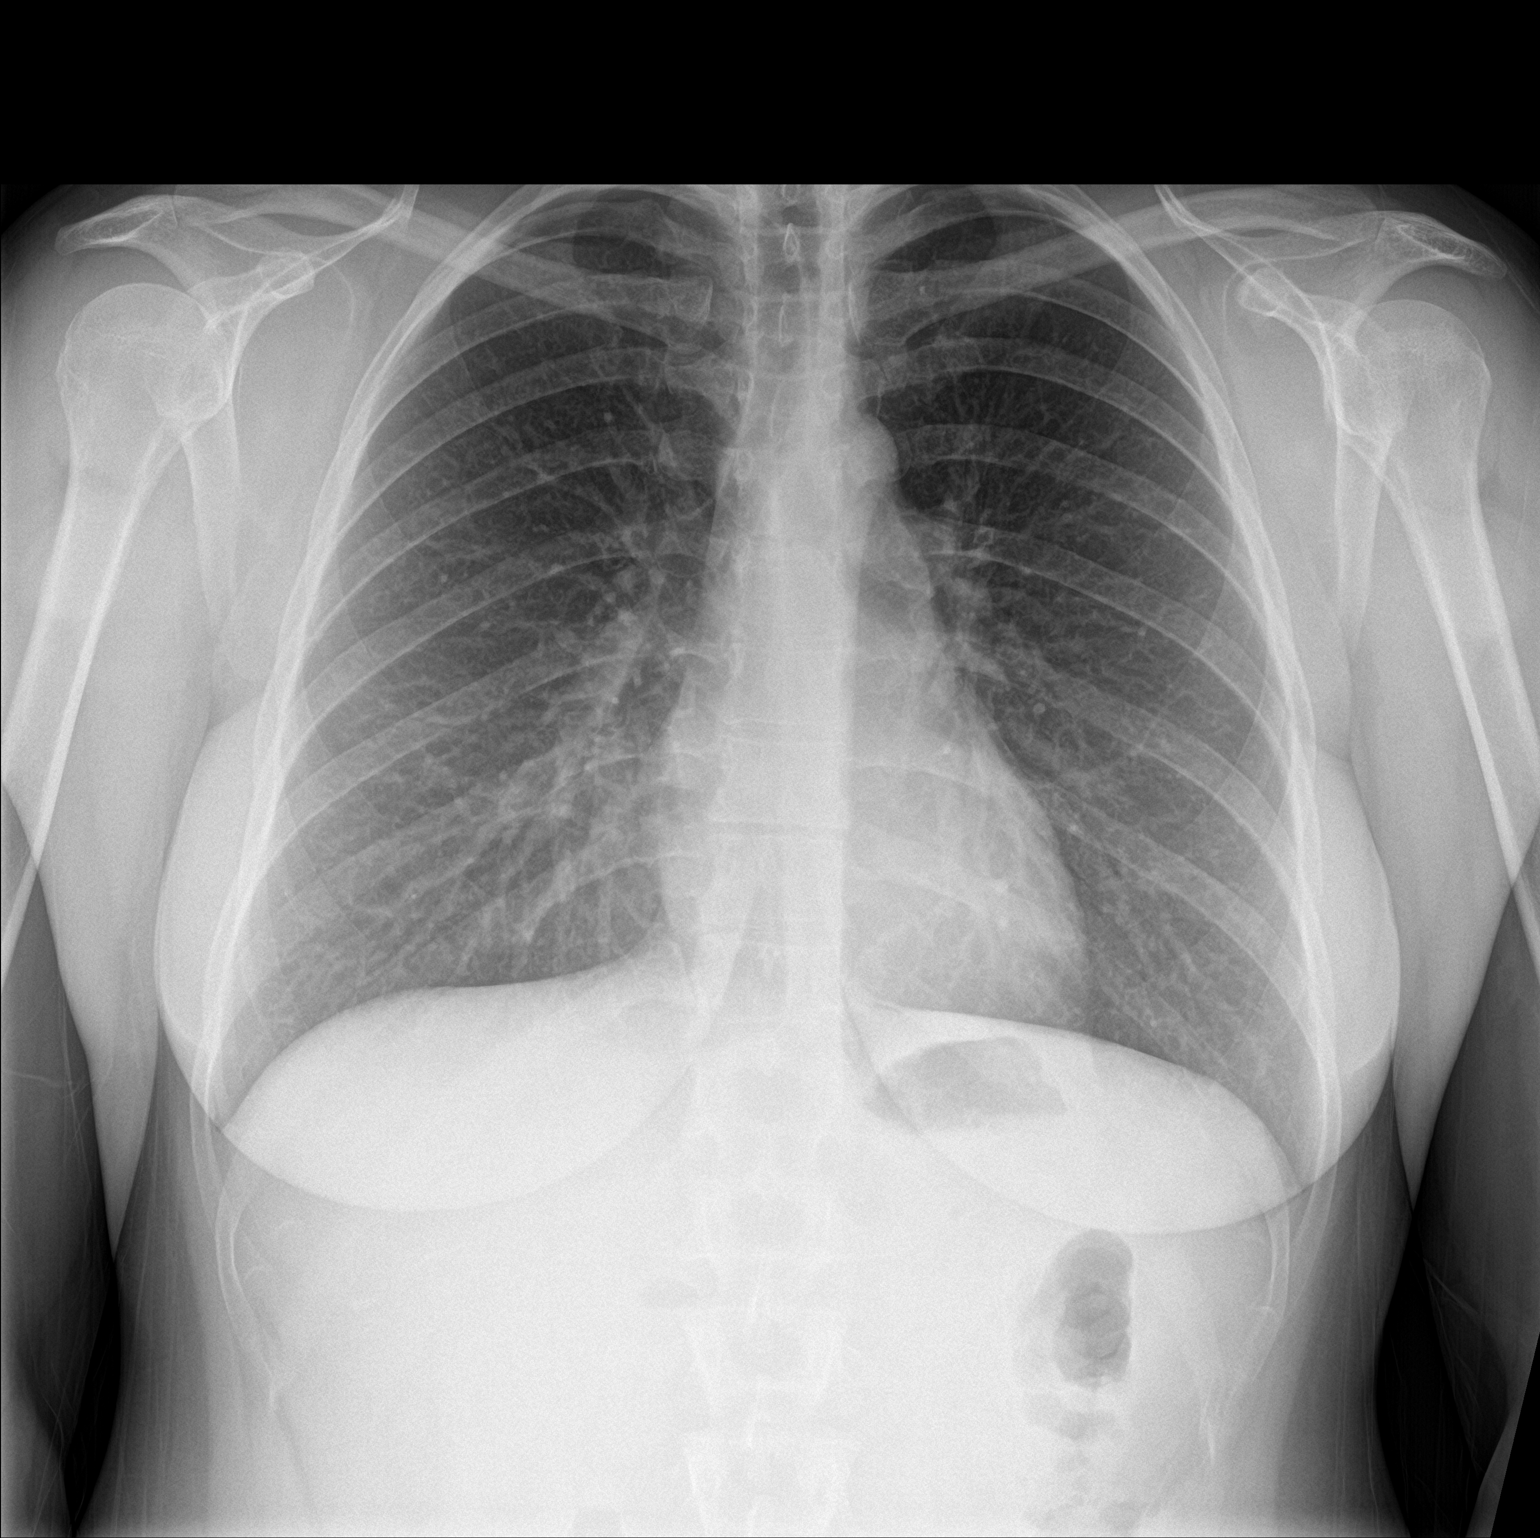

[chest lat]
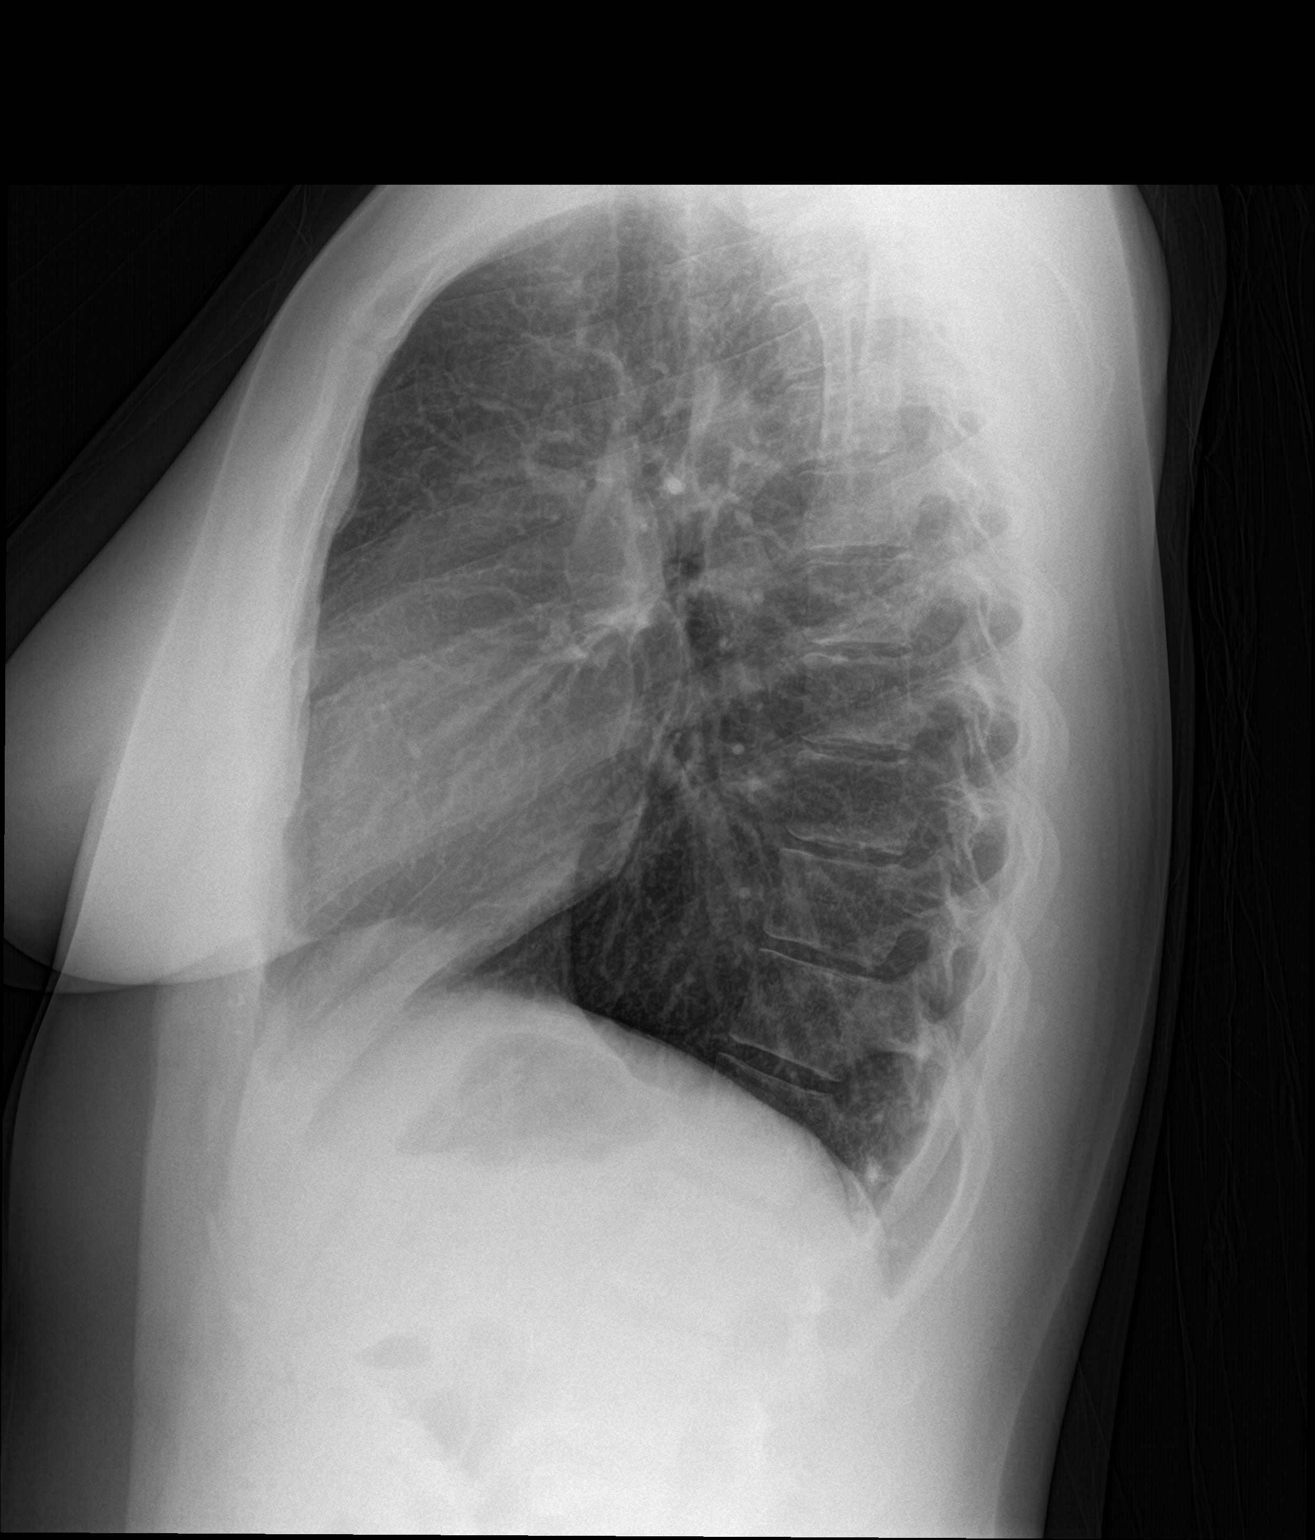

[2 of 2 positions shown; findings below may reference images not displayed]

FINDINGS: The heart size and mediastinal contours are within normal limits.
Both lungs are clear. The visualized skeletal structures are
unremarkable.
IMPRESSION: Normal chest.

## 2019-03-30 ENCOUNTER — Encounter: Payer: Self-pay | Admitting: Family Medicine

## 2019-04-03 ENCOUNTER — Ambulatory Visit (INDEPENDENT_AMBULATORY_CARE_PROVIDER_SITE_OTHER): Payer: Self-pay | Admitting: Family Medicine

## 2019-04-03 ENCOUNTER — Encounter: Payer: Self-pay | Admitting: Family Medicine

## 2019-04-03 DIAGNOSIS — L709 Acne, unspecified: Secondary | ICD-10-CM | POA: Insufficient documentation

## 2019-04-03 DIAGNOSIS — G43109 Migraine with aura, not intractable, without status migrainosus: Secondary | ICD-10-CM

## 2019-04-03 DIAGNOSIS — L700 Acne vulgaris: Secondary | ICD-10-CM

## 2019-04-03 MED ORDER — TRETINOIN 0.05 % EX GEL: 1.0000 "application " | Freq: Every day | CUTANEOUS | 12 refills | Status: AC

## 2019-04-03 MED ORDER — PROMETHAZINE HCL 25 MG PO TABS: 25.0000 mg | ORAL_TABLET | Freq: Four times a day (QID) | ORAL | 2 refills | Status: AC | PRN

## 2019-04-03 MED ORDER — SUMATRIPTAN SUCCINATE 50 MG PO TABS: 50.0000 mg | ORAL_TABLET | ORAL | 12 refills | Status: AC | PRN

## 2019-04-03 MED ORDER — TOPIRAMATE 50 MG PO TABS: ORAL_TABLET | ORAL | 1 refills | Status: AC

## 2019-04-03 NOTE — Progress Notes (Signed)
Courtney Frey is a 28 y.o. female who [facility] today for headache and visual changes.  Courtney Frey has a ongoing history of right-sided headaches associated with flashes of light in vision changes in her eye.  She is had some work-up for this recently.  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  She notes that she has frequent episodes of flashing light or a fuzzy light in her right eye.  Sometimes this is associated with migraine headache and sometimes at night.  When she does have a migraine headache she notes that the pain can be severe and often is associate with vomiting.  She denies any weakness or numbness or loss of function.  She is not had much treatment for this yet.  She is had trials of over-the-counter medicines which help a little.  She notes that she has a family history of aneurysm and she is worried that she may have an aneurysm.  She is not currently breast-feeding.  She also notes that her acne has returned a bit.  She previously was applying tretinoin cream however she had to discontinue it with pregnancy.  She like to restart if possible.  ROS as above:  Exam:  BP 107/73   Pulse 81   Temp 98.1 F (36.7 C) (Oral)   Wt 170 lb (77.1 kg)   BMI 29.18 kg/m  Wt Readings from Last 5 Encounters:  04/03/19 170 lb (77.1 kg)  01/18/18 170 lb (77.1 kg)  12/29/17 167 lb (75.8 kg)  09/21/17 154 lb (69.9 kg)  07/31/17 153 lb (69.4 kg)    Gen: Well NAD HEENT: EOMI,  MMM normal funduscopic exam bilateral eyes.  Normal visual acuity.  Normal visual fields Lungs: Normal work of breathing. CTABL Heart: RRR no MRG Abd: NABS, Soft. Nondistended, Nontender Exts: Brisk capillary refill, warm and well perfused.  Neuro alert and  oriented normal coordination balance and gait.  Normal speech and thought process. Skin: Acne present    Assessment and Plan: 28 y.o. female with  Headaches.  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 I think it is reasonable to treat this multifactorial.  Will use topiramate for prophylaxis and sumatriptan and promethazine as needed.  Okay to take with ibuprofen or Aleve.  If not improving would be reasonable at some point to get neuroimaging.  Reasonable to get MRI MRA.  Patient does not health insurance and therefore cost is certainly a factor here may delay neuroimaging based on that logic.  However she has good response to the above treatment I think aneurysm is super unlikely and proceed with just treatment.  Additionally patient notes acne and would like to restart tretinoin cream.  She had good results with this prior to pregnancy.   Patient will see me a MyChart message in a few weeks to report back how things are going. PDMP not reviewed this encounter. No orders of the defined types were placed in this encounter.  Meds ordered this encounter  Medications  . topiramate (TOPAMAX) 50 MG tablet    Sig: One half tab by mouth daily for a week, then one tab by mouth daily.    Dispense:  30 tablet    Refill:  1  . SUMAtriptan (IMITREX) 50 MG tablet  Sig: Take 1 tablet (50 mg total) by mouth every 2 (two) hours as needed for migraine. May repeat in 2 hours if headache persists or recurs.    Dispense:  9 tablet    Refill:  12  . promethazine (PHENERGAN) 25 MG tablet    Sig: Take 1 tablet (25 mg total) by mouth every 6 (six) hours as needed for nausea or vomiting (or migraine).    Dispense:  30 tablet    Refill:  2  . tretinoin (ALTRALIN) 0.05 % gel    Sig: Apply 1 application topically at bedtime.    Dispense:  45 g    Refill:  12     Historical information moved to improve visibility of documentation.  Past Medical  History:  Diagnosis Date  . Generalized anxiety disorder 11/30/2015  . Mild intermittent asthma 01/18/2018   Past Surgical History:  Procedure Laterality Date  . CESAREAN SECTION    . WISDOM TOOTH EXTRACTION     Social History   Tobacco Use  . Smoking status: Former Games developer  . Smokeless tobacco: Never Used  Substance Use Topics  . Alcohol use: Yes    Alcohol/week: 1.0 standard drinks    Types: 1 Glasses of wine per week   family history includes Alcohol abuse in her maternal uncle; Diabetes in her maternal grandmother and maternal uncle; Hypertension in her maternal grandmother; Migraines in her mother.  Medications: Current Outpatient Medications  Medication Sig Dispense Refill  . promethazine (PHENERGAN) 25 MG tablet Take 1 tablet (25 mg total) by mouth every 6 (six) hours as needed for nausea or vomiting (or migraine). 30 tablet 2  . SUMAtriptan (IMITREX) 50 MG tablet Take 1 tablet (50 mg total) by mouth every 2 (two) hours as needed for migraine. May repeat in 2 hours if headache persists or recurs. 9 tablet 12  . topiramate (TOPAMAX) 50 MG tablet One half tab by mouth daily for a week, then one tab by mouth daily. 30 tablet 1  . tretinoin (ALTRALIN) 0.05 % gel Apply 1 application topically at bedtime. 45 g 12   No current facility-administered medications for this visit.    Allergies  Allergen Reactions  . Chlorhexidine Gluconate Shortness Of Breath and Swelling  . Oxytocin Rash and Swelling  . Penicillins      Discussed warning signs or symptoms. Please see discharge instructions. Patient expresses understanding.

## 2019-04-03 NOTE — Patient Instructions (Addendum)
Thank you for coming in today. I think your symptoms are due to migraine.  Take sumatriptan as needed at the onset of migraine. Ok to take with promethazine for nausea as well.  Ok to also take with ibuprofen or aleve.    Take topimerate daily for migraine prevention. Start at 1/2 pill for 1 week.  Increase to 1 pill in 1 week.  We can increase from there.   Use the tretinoin cream for acne. Let me know if too strong.   Send me a mychart update in 2 weeks.   Sumatriptan tablets What is this medicine? SUMATRIPTAN (soo ma TRIP tan) is used to treat migraines with or without aura. An aura is a strange feeling or visual disturbance that warns you of an attack. It is not used to prevent migraines. This medicine may be used for other purposes; ask your health care provider or pharmacist if you have questions. COMMON BRAND NAME(S): Imitrex, Migraine Pack What should I tell my health care provider before I take this medicine? They need to know if you have any of these conditions: -cigarette smoker -circulation problems in fingers and toes -diabetes -heart disease -high blood pressure -high cholesterol -history of irregular heartbeat -history of stroke -kidney disease -liver disease -stomach or intestine problems -an unusual or allergic reaction to sumatriptan, other medicines, foods, dyes, or preservatives -pregnant or trying to get pregnant -breast-feeding How should I use this medicine? Take this medicine by mouth with a glass of water. Follow the directions on the prescription label. Do not take it more often than directed. Talk to your pediatrician regarding the use of this medicine in children. Special care may be needed. Overdosage: If you think you have taken too much of this medicine contact a poison control center or emergency room at once. NOTE: This medicine is only for you. Do not share this medicine with others. What if I miss a dose? This does not apply. This medicine  is not for regular use. What may interact with this medicine? Do not take this medicine with any of the following medicines: -certain medicines for migraine headache like almotriptan, eletriptan, frovatriptan, naratriptan, rizatriptan, sumatriptan, zolmitriptan -ergot alkaloids like dihydroergotamine, ergonovine, ergotamine, methylergonovine -MAOIs like Carbex, Eldepryl, Marplan, Nardil, and Parnate This medicine may also interact with the following medications: -certain medicines for depression, anxiety, or psychotic disorders This list may not describe all possible interactions. Give your health care provider a list of all the medicines, herbs, non-prescription drugs, or dietary supplements you use. Also tell them if you smoke, drink alcohol, or use illegal drugs. Some items may interact with your medicine. What should I watch for while using this medicine? Visit your healthcare professional for regular checks on your progress. Tell your healthcare professional if your symptoms do not start to get better or if they get worse. You may get drowsy or dizzy. Do not drive, use machinery, or do anything that needs mental alertness until you know how this medicine affects you. Do not stand up or sit up quickly, especially if you are an older patient. This reduces the risk of dizzy or fainting spells. Alcohol may interfere with the effect of this medicine. Tell your healthcare professional right away if you have any change in your eyesight. If you take migraine medicines for 10 or more days a month, your migraines may get worse. Keep a diary of headache days and medicine use. Contact your healthcare professional if your migraine attacks occur more frequently. What side effects  may I notice from receiving this medicine? Side effects that you should report to your doctor or health care professional as soon as possible: -allergic reactions like skin rash, itching or hives, swelling of the face, lips, or  tongue -changes in vision -chest pain or chest tightness -signs and symptoms of a dangerous change in heartbeat or heart rhythm like chest pain; dizziness; fast, irregular heartbeat; palpitations; feeling faint or lightheaded; falls; breathing problems -signs and symptoms of a stroke like changes in vision; confusion; trouble speaking or understanding; severe headaches; sudden numbness or weakness of the face, arm or leg; trouble walking; dizziness; loss of balance or coordination -signs and symptoms of serotonin syndrome like irritable; confusion; diarrhea; fast or irregular heartbeat; muscle twitching; stiff muscles; trouble walking; sweating; high fever; seizures; chills; vomiting Side effects that usually do not require medical attention (report to your doctor or health care professional if they continue or are bothersome): -diarrhea -dizziness -drowsiness -dry mouth -headache -nausea, vomiting -pain, tingling, numbness in the hands or feet -stomach pain This list may not describe all possible side effects. Call your doctor for medical advice about side effects. You may report side effects to FDA at 1-800-FDA-1088. Where should I keep my medicine? Keep out of the reach of children. Store at room temperature between 2 and 30 degrees C (36 and 86 degrees F). Throw away any unused medicine after the expiration date. NOTE: This sheet is a summary. It may not cover all possible information. If you have questions about this medicine, talk to your doctor, pharmacist, or health care provider.  2019 Elsevier/Gold Standard (2018-05-08 15:05:37)   Topiramate tablets What is this medicine? TOPIRAMATE (toe PYRE a mate) is used to treat seizures in adults or children with epilepsy. It is also used for the prevention of migraine headaches. This medicine may be used for other purposes; ask your health care provider or pharmacist if you have questions. COMMON BRAND NAME(S): Topamax, Topiragen What  should I tell my health care provider before I take this medicine? They need to know if you have any of these conditions: -bleeding disorders -cirrhosis of the liver or liver disease -diarrhea -glaucoma -kidney stones or kidney disease -low blood counts, like low white cell, platelet, or red cell counts -lung disease like asthma, obstructive pulmonary disease, emphysema -metabolic acidosis -on a ketogenic diet -schedule for surgery or a procedure -suicidal thoughts, plans, or attempt; a previous suicide attempt by you or a family member -an unusual or allergic reaction to topiramate, other medicines, foods, dyes, or preservatives -pregnant or trying to get pregnant -breast-feeding How should I use this medicine? Take this medicine by mouth with a glass of water. Follow the directions on the prescription label. Do not crush or chew. You may take this medicine with meals. Take your medicine at regular intervals. Do not take it more often than directed. Talk to your pediatrician regarding the use of this medicine in children. Special care may be needed. While this drug may be prescribed for children as young as 72 years of age for selected conditions, precautions do apply. Overdosage: If you think you have taken too much of this medicine contact a poison control center or emergency room at once. NOTE: This medicine is only for you. Do not share this medicine with others. What if I miss a dose? If you miss a dose, take it as soon as you can. If your next dose is to be taken in less than 6 hours, then do not take  the missed dose. Take the next dose at your regular time. Do not take double or extra doses. What may interact with this medicine? Do not take this medicine with any of the following medications: -probenecid This medicine may also interact with the following medications: -acetazolamide -alcohol -amitriptyline -aspirin and aspirin-like medicines -birth control pills -certain  medicines for depression -certain medicines for seizures -certain medicines that treat or prevent blood clots like warfarin, enoxaparin, dalteparin, apixaban, dabigatran, and rivaroxaban -digoxin -hydrochlorothiazide -lithium -medicines for pain, sleep, or muscle relaxation -metformin -methazolamide -NSAIDS, medicines for pain and inflammation, like ibuprofen or naproxen -pioglitazone -risperidone This list may not describe all possible interactions. Give your health care provider a list of all the medicines, herbs, non-prescription drugs, or dietary supplements you use. Also tell them if you smoke, drink alcohol, or use illegal drugs. Some items may interact with your medicine. What should I watch for while using this medicine? Visit your doctor or health care professional for regular checks on your progress. Do not stop taking this medicine suddenly. This increases the risk of seizures if you are using this medicine to control epilepsy. Wear a medical identification bracelet or chain to say you have epilepsy or seizures, and carry a card that lists all your medicines. This medicine can decrease sweating and increase your body temperature. Watch for signs of deceased sweating or fever, especially in children. Avoid extreme heat, hot baths, and saunas. Be careful about exercising, especially in hot weather. Contact your health care provider right away if you notice a fever or decrease in sweating. You should drink plenty of fluids while taking this medicine. If you have had kidney stones in the past, this will help to reduce your chances of forming kidney stones. If you have stomach pain, with nausea or vomiting and yellowing of your eyes or skin, call your doctor immediately. You may get drowsy, dizzy, or have blurred vision. Do not drive, use machinery, or do anything that needs mental alertness until you know how this medicine affects you. To reduce dizziness, do not sit or stand up quickly,  especially if you are an older patient. Alcohol can increase drowsiness and dizziness. Avoid alcoholic drinks. If you notice blurred vision, eye pain, or other eye problems, seek medical attention at once for an eye exam. The use of this medicine may increase the chance of suicidal thoughts or actions. Pay special attention to how you are responding while on this medicine. Any worsening of mood, or thoughts of suicide or dying should be reported to your health care professional right away. This medicine may increase the chance of developing metabolic acidosis. If left untreated, this can cause kidney stones, bone disease, or slowed growth in children. Symptoms include breathing fast, fatigue, loss of appetite, irregular heartbeat, or loss of consciousness. Call your doctor immediately if you experience any of these side effects. Also, tell your doctor about any surgery you plan on having while taking this medicine since this may increase your risk for metabolic acidosis. Birth control pills may not work properly while you are taking this medicine. Talk to your doctor about using an extra method of birth control. Women who become pregnant while using this medicine may enroll in the [place] American Antiepileptic Drug Pregnancy Registry by calling [phone]. This registry collects information about the safety of antiepileptic drug use during pregnancy. What side effects may I notice from receiving this medicine? Side effects that you should report to your doctor or health care professional as soon  as possible: -allergic reactions like skin rash, itching or hives, swelling of the face, lips, or tongue -decreased sweating and/or rise in body temperature -depression -difficulty breathing, fast or irregular breathing patterns -difficulty speaking -difficulty walking or controlling muscle movements -hearing impairment -redness, blistering, peeling or loosening of the skin, including inside the  mouth -tingling, pain or numbness in the hands or feet -unusual bleeding or bruising -unusually weak or tired -worsening of mood, thoughts or actions of suicide or dying Side effects that usually do not require medical attention (report to your doctor or health care professional if they continue or are bothersome): -altered taste -back pain, joint or muscle aches and pains -diarrhea, or constipation -headache -loss of appetite -nausea -stomach upset, indigestion -tremors This list may not describe all possible side effects. Call your doctor for medical advice about side effects. You may report side effects to FDA at 1-800-FDA-1088. Where should I keep my medicine? Keep out of the reach of children. Store at room temperature between 15 and 30 degrees C (59 and 86 degrees F) in a tightly closed container. Protect from moisture. Throw away any unused medicine after the expiration date. NOTE: This sheet is a summary. It may not cover all possible information. If you have questions about this medicine, talk to your doctor, pharmacist, or health care provider.  2019 Elsevier/Gold Standard (2013-10-28 23:17:57)
